# Patient Record
Sex: Female | Born: 1995
Health system: Southern US, Community
[De-identification: ages and names within clinical notes are randomized; demographics above are authoritative.]

## PROBLEM LIST (undated history)

## (undated) DIAGNOSIS — R112 Nausea with vomiting, unspecified: Secondary | ICD-10-CM

## (undated) DIAGNOSIS — R21 Rash and other nonspecific skin eruption: Secondary | ICD-10-CM

## (undated) DIAGNOSIS — F419 Anxiety disorder, unspecified: Secondary | ICD-10-CM

## (undated) DIAGNOSIS — G4730 Sleep apnea, unspecified: Secondary | ICD-10-CM

## (undated) DIAGNOSIS — Z9889 Other specified postprocedural states: Secondary | ICD-10-CM

## (undated) DIAGNOSIS — Z8489 Family history of other specified conditions: Secondary | ICD-10-CM

## (undated) HISTORY — DX: Rash and other nonspecific skin eruption: R21

## (undated) HISTORY — PX: WISDOM TOOTH EXTRACTION: SHX21

---
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
## 2016-03-31 DIAGNOSIS — F329 Major depressive disorder, single episode, unspecified: Secondary | ICD-10-CM | POA: Diagnosis not present

## 2016-03-31 DIAGNOSIS — Z1389 Encounter for screening for other disorder: Secondary | ICD-10-CM | POA: Diagnosis not present

## 2016-03-31 DIAGNOSIS — Z6837 Body mass index (BMI) 37.0-37.9, adult: Secondary | ICD-10-CM | POA: Diagnosis not present

## 2016-03-31 DIAGNOSIS — F419 Anxiety disorder, unspecified: Secondary | ICD-10-CM | POA: Diagnosis not present

## 2016-04-05 DIAGNOSIS — G4730 Sleep apnea, unspecified: Secondary | ICD-10-CM | POA: Diagnosis not present

## 2016-04-05 DIAGNOSIS — Z6837 Body mass index (BMI) 37.0-37.9, adult: Secondary | ICD-10-CM | POA: Diagnosis not present

## 2016-04-25 DIAGNOSIS — G4733 Obstructive sleep apnea (adult) (pediatric): Secondary | ICD-10-CM | POA: Diagnosis not present

## 2016-05-05 DIAGNOSIS — G4733 Obstructive sleep apnea (adult) (pediatric): Secondary | ICD-10-CM | POA: Diagnosis not present

## 2016-06-05 DIAGNOSIS — G4733 Obstructive sleep apnea (adult) (pediatric): Secondary | ICD-10-CM | POA: Diagnosis not present

## 2016-07-05 DIAGNOSIS — Z79899 Other long term (current) drug therapy: Secondary | ICD-10-CM | POA: Diagnosis not present

## 2016-07-05 DIAGNOSIS — G4733 Obstructive sleep apnea (adult) (pediatric): Secondary | ICD-10-CM | POA: Diagnosis not present

## 2016-07-05 DIAGNOSIS — F419 Anxiety disorder, unspecified: Secondary | ICD-10-CM | POA: Diagnosis not present

## 2016-07-05 DIAGNOSIS — Z1389 Encounter for screening for other disorder: Secondary | ICD-10-CM | POA: Diagnosis not present

## 2016-07-05 DIAGNOSIS — Z6837 Body mass index (BMI) 37.0-37.9, adult: Secondary | ICD-10-CM | POA: Diagnosis not present

## 2016-08-05 DIAGNOSIS — G4733 Obstructive sleep apnea (adult) (pediatric): Secondary | ICD-10-CM | POA: Diagnosis not present

## 2016-09-04 DIAGNOSIS — G4733 Obstructive sleep apnea (adult) (pediatric): Secondary | ICD-10-CM | POA: Diagnosis not present

## 2016-10-05 DIAGNOSIS — G4733 Obstructive sleep apnea (adult) (pediatric): Secondary | ICD-10-CM | POA: Diagnosis not present

## 2016-10-09 DIAGNOSIS — R350 Frequency of micturition: Secondary | ICD-10-CM | POA: Diagnosis not present

## 2016-10-09 DIAGNOSIS — F419 Anxiety disorder, unspecified: Secondary | ICD-10-CM | POA: Diagnosis not present

## 2016-10-09 DIAGNOSIS — Z6836 Body mass index (BMI) 36.0-36.9, adult: Secondary | ICD-10-CM | POA: Diagnosis not present

## 2016-10-09 DIAGNOSIS — N342 Other urethritis: Secondary | ICD-10-CM | POA: Diagnosis not present

## 2016-10-09 DIAGNOSIS — Z1389 Encounter for screening for other disorder: Secondary | ICD-10-CM | POA: Diagnosis not present

## 2017-03-07 DIAGNOSIS — G4733 Obstructive sleep apnea (adult) (pediatric): Secondary | ICD-10-CM | POA: Diagnosis not present

## 2017-04-07 DIAGNOSIS — G4733 Obstructive sleep apnea (adult) (pediatric): Secondary | ICD-10-CM | POA: Diagnosis not present

## 2017-05-05 DIAGNOSIS — G4733 Obstructive sleep apnea (adult) (pediatric): Secondary | ICD-10-CM | POA: Diagnosis not present

## 2017-05-08 ENCOUNTER — Emergency Department (HOSPITAL_COMMUNITY): Payer: BLUE CROSS/BLUE SHIELD

## 2017-05-08 ENCOUNTER — Other Ambulatory Visit: Payer: Self-pay

## 2017-05-08 ENCOUNTER — Emergency Department (HOSPITAL_COMMUNITY)
Admission: EM | Admit: 2017-05-08 | Discharge: 2017-05-08 | Disposition: A | Discharge status: Home / Self Care | Payer: BLUE CROSS/BLUE SHIELD | Attending: Emergency Medicine | Admitting: Emergency Medicine

## 2017-05-08 ENCOUNTER — Encounter (HOSPITAL_COMMUNITY): Payer: Self-pay | Admitting: Emergency Medicine

## 2017-05-08 DIAGNOSIS — K529 Noninfective gastroenteritis and colitis, unspecified: Secondary | ICD-10-CM

## 2017-05-08 DIAGNOSIS — K5909 Other constipation: Secondary | ICD-10-CM

## 2017-05-08 DIAGNOSIS — R1031 Right lower quadrant pain: Secondary | ICD-10-CM | POA: Diagnosis not present

## 2017-05-08 DIAGNOSIS — R109 Unspecified abdominal pain: Secondary | ICD-10-CM | POA: Diagnosis not present

## 2017-05-08 DIAGNOSIS — R110 Nausea: Secondary | ICD-10-CM | POA: Diagnosis not present

## 2017-05-08 HISTORY — DX: Anxiety disorder, unspecified: F41.9

## 2017-05-08 LAB — COMPREHENSIVE METABOLIC PANEL
ALK PHOS: 91 U/L (ref 38–126)
ALT: 28 U/L (ref 14–54)
AST: 108 U/L — AB (ref 15–41)
Albumin: 3.7 g/dL (ref 3.5–5.0)
Anion gap: 13 (ref 5–15)
BILIRUBIN TOTAL: 0.4 mg/dL (ref 0.3–1.2)
BUN: 10 mg/dL (ref 6–20)
CALCIUM: 9.3 mg/dL (ref 8.9–10.3)
CHLORIDE: 104 mmol/L (ref 101–111)
CO2: 21 mmol/L — ABNORMAL LOW (ref 22–32)
CREATININE: 0.84 mg/dL (ref 0.44–1.00)
GFR calc Af Amer: >60 mL/min (ref >60)
Glucose, Bld: 110 mg/dL — ABNORMAL HIGH (ref 65–99)
Potassium: 3.6 mmol/L (ref 3.5–5.1)
Sodium: 138 mmol/L (ref 135–145)
Total Protein: 7.9 g/dL (ref 6.5–8.1)

## 2017-05-08 LAB — CBC
HCT: 39.3 % (ref 36.0–46.0)
Hemoglobin: 12.9 g/dL (ref 12.0–15.0)
MCH: 27.2 pg (ref 26.0–34.0)
MCHC: 32.8 g/dL (ref 30.0–36.0)
MCV: 82.7 fL (ref 78.0–100.0)
PLATELETS: 404 10*3/uL — AB (ref 150–400)
RBC: 4.75 MIL/uL (ref 3.87–5.11)
RDW: 12.8 % (ref 11.5–15.5)
WBC: 15.1 10*3/uL — AB (ref 4.0–10.5)

## 2017-05-08 LAB — URINALYSIS, ROUTINE W REFLEX MICROSCOPIC
BILIRUBIN URINE: NEGATIVE
Glucose, UA: NEGATIVE mg/dL
Ketones, ur: NEGATIVE mg/dL
LEUKOCYTES UA: NEGATIVE
Nitrite: NEGATIVE
PH: 5 (ref 5.0–8.0)
Protein, ur: NEGATIVE mg/dL
SPECIFIC GRAVITY, URINE: 1.017 (ref 1.005–1.030)

## 2017-05-08 LAB — LIPASE, BLOOD: LIPASE: 24 U/L (ref 11–51)

## 2017-05-08 LAB — HCG, QUANTITATIVE, PREGNANCY: hCG, Beta Chain, Quant, S: <1 m[IU]/mL (ref <5)

## 2017-05-08 MED ORDER — HYDROMORPHONE HCL 1 MG/ML IJ SOLN
0.5000 mg | Freq: Once | INTRAMUSCULAR | Status: AC
Start: 2017-05-08 — End: 2017-05-08
  Administered 2017-05-08: 0.5 mg via INTRAVENOUS
  Filled 2017-05-08: qty 1

## 2017-05-08 MED ORDER — IOPAMIDOL (ISOVUE-300) INJECTION 61%
100.0000 mL | Freq: Once | INTRAVENOUS | Status: AC | PRN
  Administered 2017-05-08: 100 mL via INTRAVENOUS

## 2017-05-08 MED ORDER — TRAMADOL HCL 50 MG PO TABS: 50.0000 mg | ORAL_TABLET | Freq: Four times a day (QID) | ORAL | 0 refills | Status: DC | PRN

## 2017-05-08 MED ORDER — PROMETHAZINE HCL 25 MG/ML IJ SOLN
12.5000 mg | Freq: Four times a day (QID) | INTRAMUSCULAR | Status: DC | PRN
Start: 2017-05-08 — End: 2017-05-09
  Administered 2017-05-08: 12.5 mg via INTRAVENOUS
  Filled 2017-05-08: qty 1

## 2017-05-08 MED ORDER — MAGNESIUM HYDROXIDE 400 MG/5ML PO SUSP
30.0000 mL | Freq: Once | ORAL | Status: AC
  Administered 2017-05-08: 30 mL via ORAL
  Filled 2017-05-08: qty 30

## 2017-05-08 MED ORDER — HYDROMORPHONE HCL 1 MG/ML IJ SOLN
0.5000 mg | Freq: Once | INTRAMUSCULAR | Status: AC
  Administered 2017-05-08: 0.5 mg via INTRAVENOUS
  Filled 2017-05-08: qty 1

## 2017-05-08 MED ORDER — PROMETHAZINE HCL 12.5 MG PO TABS: 12.5000 mg | ORAL_TABLET | Freq: Four times a day (QID) | ORAL | 0 refills | Status: DC | PRN

## 2017-05-08 MED ORDER — ONDANSETRON HCL 4 MG/2ML IJ SOLN
4.0000 mg | Freq: Once | INTRAMUSCULAR | Status: AC
  Administered 2017-05-08: 4 mg via INTRAVENOUS
  Filled 2017-05-08: qty 2

## 2017-05-08 NOTE — Discharge Instructions (Signed)
Your CT scan is negative for abscess, appendicitis, gallbladder problems, or other emergent changes.  Your examination does show a condition called enteritis, which is an infection or inflammation involving your small intestines.  There is also noticed areas of increased stool and trapped gas.  Please use the laxative of your choice.  Please increase the fiber in your diet.  Increase fluids.  Enteritis will usually clear up in a few days on its own.  If you are not improving in the next 2-3 days, please see Dr. Benard Rinkoark for additional evaluation.  Return to the emergency department if any changes in your condition, problems, or concerns.

## 2017-05-08 NOTE — ED Triage Notes (Signed)
Pt states she was laying down and had sudden onset of right lower abdominal pain with nausea.

## 2017-05-08 NOTE — ED Provider Notes (Signed)
[facility] EMERGENCY DEPARTMENT Provider Note   CSN: [id number] Arrival date & time: 05/08/17  1650     History   Chief Complaint Chief Complaint  Patient presents with  . Abdominal Pain    HPI Jane Rivera is a 22 y.o. female.  Patient is a 22 year old female who presents to the emergency department with complaint of abdominal pain.  The patient states this problem started approximately 2 PM today.  She noted a pain in the right lower abdomen.  She rated it as a 5 on a scale of 1-10 when this started.  She had not had any previous abdominal pain, nausea, or vomiting up to that time.  She has been working out earlier during the day, but she states she does not feel that the workout had anything to do with the pain.  She describes the pain as mostly a throbbing type pain, but at times it was sharp.  The pain seems to come and go.  At the time of interview the pain is now a 7 on a scale of 1-10.  There is no vomiting appreciated, but there is some nausea noted.  There is been no recent trauma or injury reported, no recent operations are reported.  The patient's last bowel movement was earlier today, and there was no problem with constipation or diarrhea.  The last menstrual cycle was last week.  The patient is on birth control pills, and is not had a change in her medication recently.  No recent problems with dysuria.  And no recent problems with some vaginal discharge.  The patient presents now for assistance with this issue.   The history is provided by the patient.    Past Medical History:  Diagnosis Date  . Anxiety     There are no active problems to display for this patient.   Past Surgical History:  Procedure Laterality Date  . WISDOM TOOTH EXTRACTION      OB History    No data available       Home Medications    Prior to Admission medications   Not on File    Family History No family history on file.  Social History Social History   Tobacco Use  .  Smoking status: Never Smoker  . Smokeless tobacco: Never Used  Substance Use Topics  . Alcohol use: No    Frequency: Never  . Drug use: No     Allergies   Patient has no allergy information on record.   Review of Systems Review of Systems  Constitutional: Positive for activity change. Negative for fever.       All ROS Neg except as noted in HPI  HENT: Negative for nosebleeds.   Eyes: Negative for photophobia and discharge.  Respiratory: Negative for cough, shortness of breath and wheezing.   Cardiovascular: Negative for chest pain and palpitations.  Gastrointestinal: Positive for abdominal pain and nausea. Negative for blood in stool, constipation, diarrhea and vomiting.  Genitourinary: Negative for dysuria, frequency, hematuria, vaginal bleeding, vaginal discharge and vaginal pain.  Musculoskeletal: Negative for arthralgias, back pain and neck pain.  Skin: Negative.   Neurological: Negative for dizziness, seizures and speech difficulty.  Psychiatric/Behavioral: Negative for confusion and hallucinations.     Physical Exam Updated Vital Signs BP 118/74 (BP Location: Right Arm)   Pulse 84   Temp 97.7 F (36.5 C) (Oral)   Resp 16   Ht 5\' 4"  (1.626 m)   Wt 95.3 kg (210 lb)  SpO2 93%   BMI 36.05 kg/m   Physical Exam  Constitutional: She is oriented to person, place, and time. She appears well-developed and well-nourished.  Non-toxic appearance.  HENT:  Head: Normocephalic.  Right Ear: Tympanic membrane and external ear normal.  Left Ear: Tympanic membrane and external ear normal.  Eyes: EOM and lids are normal. Pupils are equal, round, and reactive to light.  Neck: Normal range of motion. Neck supple. Carotid bruit is not present.  Cardiovascular: Normal rate, regular rhythm, normal heart sounds, intact distal pulses and normal pulses.  Pulmonary/Chest: Breath sounds normal. No respiratory distress.  Abdominal: Soft. Bowel sounds are normal. There is no splenomegaly  or hepatomegaly. There is tenderness in the right lower quadrant. There is guarding.  Right straight leg raise increases pain of the right lower quadrant.  Musculoskeletal: Normal range of motion.  Lymphadenopathy:       Head (right side): No submandibular adenopathy present.       Head (left side): No submandibular adenopathy present.    She has no cervical adenopathy.  Neurological: She is alert and oriented to person, place, and time. She has normal strength. No cranial nerve deficit or sensory deficit.  Skin: Skin is warm and dry.  Psychiatric: She has a normal mood and affect. Her speech is normal.  Nursing note and vitals reviewed.    ED Treatments / Results  Labs (all labs ordered are listed, but only abnormal results are displayed) Labs Reviewed  COMPREHENSIVE METABOLIC PANEL - Abnormal; Notable for the following components:      Result Value   CO2 21 (*)    Glucose, Bld 110 (*)    AST 108 (*)    All other components within normal limits  CBC - Abnormal; Notable for the following components:   WBC 15.1 (*)    Platelets 404 (*)    All other components within normal limits  URINALYSIS, ROUTINE W REFLEX MICROSCOPIC - Abnormal; Notable for the following components:   Hgb urine dipstick MODERATE (*)    Bacteria, UA RARE (*)    Squamous Epithelial / LPF 0-5 (*)    All other components within normal limits  LIPASE, BLOOD  HCG, QUANTITATIVE, PREGNANCY    EKG  EKG Interpretation None       Radiology No results found.  Procedures Procedures (including critical care time)  Medications Ordered in ED Medications - No data to display   Initial Impression / Assessment and Plan / ED Course  I have reviewed the triage vital signs and the nursing notes.  Pertinent labs & imaging results that were available during my care of the patient were reviewed by me and considered in my medical decision making (see chart for details).      Final Clinical Impressions(s) / ED  Diagnoses MDM  Vital signs reviewed. Lab work reviewed. WBC elevated at 15,000. preg test neg. Chem negative for acute problem.  IV pain meds given. CT scan ordered.  CT scan reveals enteritis of small intestine and constipation/stool burden.  Pt advised on the enteritis. Instruction for high fiber diet given. Rx for ultram given for pain. F/u with Dr Jena Gauss [facility] if not improving.   Final diagnoses:  Enteritis  Other constipation    ED Discharge Orders    None       Ivery Quale, PA-C 05/08/17 2308    Eber Hong, MD 05/09/17 [id number]

## 2017-05-14 DIAGNOSIS — Z1389 Encounter for screening for other disorder: Secondary | ICD-10-CM | POA: Diagnosis not present

## 2017-05-14 DIAGNOSIS — K589 Irritable bowel syndrome without diarrhea: Secondary | ICD-10-CM | POA: Diagnosis not present

## 2017-05-14 DIAGNOSIS — K5909 Other constipation: Secondary | ICD-10-CM | POA: Diagnosis not present

## 2017-05-14 DIAGNOSIS — Z6837 Body mass index (BMI) 37.0-37.9, adult: Secondary | ICD-10-CM | POA: Diagnosis not present

## 2017-05-14 DIAGNOSIS — F329 Major depressive disorder, single episode, unspecified: Secondary | ICD-10-CM | POA: Diagnosis not present

## 2017-05-22 ENCOUNTER — Encounter: Payer: Self-pay | Admitting: Internal Medicine

## 2017-07-24 ENCOUNTER — Ambulatory Visit: Payer: BLUE CROSS/BLUE SHIELD | Admitting: Gastroenterology

## 2017-07-24 ENCOUNTER — Encounter: Payer: Self-pay | Admitting: Gastroenterology

## 2017-07-24 VITALS — BP 122/100 | HR 108 | Temp 98.0°F | Ht 64.0 in | Wt 222.2 lb

## 2017-07-24 DIAGNOSIS — K589 Irritable bowel syndrome without diarrhea: Secondary | ICD-10-CM | POA: Insufficient documentation

## 2017-07-24 DIAGNOSIS — R1013 Epigastric pain: Secondary | ICD-10-CM | POA: Diagnosis not present

## 2017-07-24 DIAGNOSIS — K219 Gastro-esophageal reflux disease without esophagitis: Secondary | ICD-10-CM | POA: Insufficient documentation

## 2017-07-24 DIAGNOSIS — K582 Mixed irritable bowel syndrome: Secondary | ICD-10-CM

## 2017-07-24 MED ORDER — LINACLOTIDE 145 MCG PO CAPS: 145.0000 ug | ORAL_CAPSULE | Freq: Every day | ORAL | 5 refills | Status: DC

## 2017-07-24 NOTE — Patient Instructions (Signed)
1. Try Linzess once daily before breakfast for constipation. Call if too expensive/not covered on insurance.  2. Please have your labs done. We will contact you with results as available.  3. Return in 3 months for follow up but call sooner if needed or send me a message on MyChart.       Irritable Bowel Syndrome, Adult Irritable bowel syndrome (IBS) is not one specific disease. It is a group of symptoms that affects the organs responsible for digestion (gastrointestinal or GI tract). To regulate how your GI tract works, your body sends signals back and forth between your intestines and your brain. If you have IBS, there may be a problem with these signals. As a result, your GI tract does not function normally. Your intestines may become more sensitive and overreact to certain things. This is especially true when you eat certain foods or when you are under stress. There are four types of IBS. These may be determined based on the consistency of your stool:  IBS with diarrhea.  IBS with constipation.  Mixed IBS.  Unsubtyped IBS.  It is important to know which type of IBS you have. Some treatments are more likely to be helpful for certain types of IBS. What are the causes? The exact cause of IBS is not known. What increases the risk? You may have a higher risk of IBS if:  You are a woman.  You are younger than 23 years old.  You have a family history of IBS.  You have mental health problems.  You have had bacterial infection of your GI tract.  What are the signs or symptoms? Symptoms of IBS vary from person to person. The main symptom is abdominal pain or discomfort. Additional symptoms usually include one or more of the following:  Diarrhea, constipation, or both.  Abdominal swelling or bloating.  Feeling full or sick after eating a small or regular-size meal.  Frequent gas.  Mucus in the stool.  A feeling of having more stool left after a bowel movement.  Symptoms  tend to come and go. They may be associated with stress, psychiatric conditions, or nothing at all. How is this diagnosed? There is no specific test to diagnose IBS. Your health care provider will make a diagnosis based on a physical exam, medical history, and your symptoms. You may have other tests to rule out other conditions that may be causing your symptoms. These may include:  Blood tests.  X-rays.  CT scan.  Endoscopy and colonoscopy. This is a test in which your GI tract is viewed with a long, thin, flexible tube.  How is this treated? There is no cure for IBS, but treatment can help relieve symptoms. IBS treatment often includes:  Changes to your diet, such as: ? Eating more fiber. ? Avoiding foods that cause symptoms. ? Drinking more water. ? Eating regular, medium-sized portioned meals.  Medicines. These may include: ? Fiber supplements if you have constipation. ? Medicine to control diarrhea (antidiarrheal medicines). ? Medicine to help control muscle spasms in your GI tract (antispasmodic medicines). ? Medicines to help with any mental health issues, such as antidepressants or tranquilizers.  Therapy. ? Talk therapy may help with anxiety, depression, or other mental health issues that can make IBS symptoms worse.  Stress reduction. ? Managing your stress can help keep symptoms under control.  Follow these instructions at home:  Take medicines only as directed by your health care provider.  Eat a healthy diet. ? Avoid foods  and drinks with added sugar. ? Include more whole grains, fruits, and vegetables gradually into your diet. This may be especially helpful if you have IBS with constipation. ? Avoid any foods and drinks that make your symptoms worse. These may include dairy products and caffeinated or carbonated drinks. ? Do not eat large meals. ? Drink enough fluid to keep your urine clear or pale yellow.  Exercise regularly. Ask your health care provider for  recommendations of good activities for you.  Keep all follow-up visits as directed by your health care provider. This is important. Contact a health care provider if:  You have constant pain.  You have trouble or pain with swallowing.  You have worsening diarrhea. Get help right away if:  You have severe and worsening abdominal pain.  You have diarrhea and: ? You have a rash, stiff neck, or severe headache. ? You are irritable, sleepy, or difficult to awaken. ? You are weak, dizzy, or extremely thirsty.  You have bright red blood in your stool or you have black tarry stools.  You have unusual abdominal swelling that is painful.  You vomit continuously.  You vomit blood (hematemesis).  You have both abdominal pain and a fever. This information is not intended to replace advice given to you by your health care provider. Make sure you discuss any questions you have with your health care provider. Document Released: 02/06/2005 Document Revised: 07/09/2015 Document Reviewed: 10/24/2013 Elsevier Interactive Patient Education  2018 [employer].    Diet for Irritable Bowel Syndrome When you have irritable bowel syndrome (IBS), the foods you eat and your eating habits are very important. IBS may cause various symptoms, such as abdominal pain, constipation, or diarrhea. Choosing the right foods can help ease discomfort caused by these symptoms. Work with your health care provider and dietitian to find the best eating plan to help control your symptoms. What general guidelines do I need to follow?  Keep a food diary. This will help you identify foods that cause symptoms. Write down: ? What you eat and when. ? What symptoms you have. ? When symptoms occur in relation to your meals.  Avoid foods that cause symptoms. Talk with your dietitian about other ways to get the same nutrients that are in these foods.  Eat more foods that contain fiber. Take a fiber supplement if directed by  your dietitian.  Eat your meals slowly, in a relaxed setting.  Aim to eat 5-6 small meals per day. Do not skip meals.  Drink enough fluids to keep your urine clear or pale yellow.  Ask your health care provider if you should take an over-the-counter probiotic during flare-ups to help restore healthy gut bacteria.  If you have cramping or diarrhea, try making your meals low in fat and high in carbohydrates. Examples of carbohydrates are pasta, rice, whole grain breads and cereals, fruits, and vegetables.  If dairy products cause your symptoms to flare up, try eating less of them. You might be able to handle yogurt better than other dairy products because it contains bacteria that help with digestion. What foods are not recommended? The following are some foods and drinks that may worsen your symptoms:  Fatty foods, such as [place] fries.  Milk products, such as cheese or ice cream.  Chocolate.  Alcohol.  Products with caffeine, such as coffee.  Carbonated drinks, such as soda.  The items listed above may not be a complete list of foods and beverages to avoid. Contact your dietitian  for more information. What foods are good sources of fiber? Your health care provider or dietitian may recommend that you eat more foods that contain fiber. Fiber can help reduce constipation and other IBS symptoms. Add foods with fiber to your diet a little at a time so that your body can get used to them. Too much fiber at once might cause gas and swelling of your abdomen. The following are some foods that are good sources of fiber:  Apples.  Peaches.  Pears.  Berries.  Figs.  Broccoli (raw).  Cabbage.  Carrots.  Raw peas.  Kidney beans.  Lima beans.  Whole grain bread.  Whole grain cereal.  Where to find more information: [employer] for Functional Gastrointestinal Disorders: www.iffgd.[employer] National Institute of Diabetes and Digestive and Kidney Diseases:  http://norris-lawson.com/www.niddk.nih.gov/health-information/health-topics/digestive-diseases/ibs/Pages/facts.aspx This information is not intended to replace advice given to you by your health care provider. Make sure you discuss any questions you have with your health care provider. Document Released: 04/29/2003 Document Revised: 07/15/2015 Document Reviewed: 05/09/2013 Elsevier Interactive Patient Education  2018 Elsevier Inc.     Gastroesophageal Reflux Disease, Adult Normally, food travels down the esophagus and stays in the stomach to be digested. However, when a person has gastroesophageal reflux disease (GERD), food and stomach acid move back up into the esophagus. When this happens, the esophagus becomes sore and inflamed. Over time, GERD can create small holes (ulcers) in the lining of the esophagus. What are the causes? This condition is caused by a problem with the muscle between the esophagus and the stomach (lower esophageal sphincter, or LES). Normally, the LES muscle closes after food passes through the esophagus to the stomach. When the LES is weakened or abnormal, it does not close properly, and that allows food and stomach acid to go back up into the esophagus. The LES can be weakened by certain dietary substances, medicines, and medical conditions, including:  Tobacco use.  Pregnancy.  Having a hiatal hernia.  Heavy alcohol use.  Certain foods and beverages, such as coffee, chocolate, onions, and peppermint.  What increases the risk? This condition is more likely to develop in:  People who have an increased body weight.  People who have connective tissue disorders.  People who use NSAID medicines.  What are the signs or symptoms? Symptoms of this condition include:  Heartburn.  Difficult or painful swallowing.  The feeling of having a lump in the throat.  Abitter taste in the mouth.  Bad breath.  Having a large amount of saliva.  Having an upset or bloated  stomach.  Belching.  Chest pain.  Shortness of breath or wheezing.  Ongoing (chronic) cough or a night-time cough.  Wearing away of tooth enamel.  Weight loss.  Different conditions can cause chest pain. Make sure to see your health care provider if you experience chest pain. How is this diagnosed? Your health care provider will take a medical history and perform a physical exam. To determine if you have mild or severe GERD, your health care provider may also monitor how you respond to treatment. You may also have other tests, including:  An endoscopy toexamine your stomach and esophagus with a small camera.  A test thatmeasures the acidity level in your esophagus.  A test thatmeasures how much pressure is on your esophagus.  A barium swallow or modified barium swallow to show the shape, size, and functioning of your esophagus.  How is this treated? The goal of treatment is to help relieve your symptoms  and to prevent complications. Treatment for this condition may vary depending on how severe your symptoms are. Your health care provider may recommend:  Changes to your diet.  Medicine.  Surgery.  Follow these instructions at home: Diet  Follow a diet as recommended by your health care provider. This may involve avoiding foods and drinks such as: ? Coffee and tea (with or without caffeine). ? Drinks that containalcohol. ? Energy drinks and sports drinks. ? Carbonated drinks or sodas. ? Chocolate and cocoa. ? Peppermint and mint flavorings. ? Garlic and onions. ? Horseradish. ? Spicy and acidic foods, including peppers, chili powder, curry powder, vinegar, hot sauces, and barbecue sauce. ? Citrus fruit juices and citrus fruits, such as oranges, lemons, and limes. ? Tomato-based foods, such as red sauce, chili, salsa, and pizza with red sauce. ? Fried and fatty foods, such as donuts, french fries, potato chips, and high-fat dressings. ? High-fat meats, such as hot  dogs and fatty cuts of red and white meats, such as rib eye steak, sausage, ham, and bacon. ? High-fat dairy items, such as whole milk, butter, and cream cheese.  Eat small, frequent meals instead of large meals.  Avoid drinking large amounts of liquid with your meals.  Avoid eating meals during the 2-3 hours before bedtime.  Avoid lying down right after you eat.  Do not exercise right after you eat. General instructions  Pay attention to any changes in your symptoms.  Take over-the-counter and prescription medicines only as told by your health care provider. Do not take aspirin, ibuprofen, or other NSAIDs unless your health care provider told you to do so.  Do not use any tobacco products, including cigarettes, chewing tobacco, and e-cigarettes. If you need help quitting, ask your health care provider.  Wear loose-fitting clothing. Do not wear anything tight around your waist that causes pressure on your abdomen.  Raise (elevate) the head of your bed 6 inches (15cm).  Try to reduce your stress, such as with yoga or meditation. If you need help reducing stress, ask your health care provider.  If you are overweight, reduce your weight to an amount that is healthy for you. Ask your health care provider for guidance about a safe weight loss goal.  Keep all follow-up visits as told by your health care provider. This is important. Contact a health care provider if:  You have new symptoms.  You have unexplained weight loss.  You have difficulty swallowing, or it hurts to swallow.  You have wheezing or a persistent cough.  Your symptoms do not improve with treatment.  You have a hoarse voice. Get help right away if:  You have pain in your arms, neck, jaw, teeth, or back.  You feel sweaty, dizzy, or light-headed.  You have chest pain or shortness of breath.  You vomit and your vomit looks like blood or coffee grounds.  You faint.  Your stool is bloody or black.  You  cannot swallow, drink, or eat. This information is not intended to replace advice given to you by your health care provider. Make sure you discuss any questions you have with your health care provider. Document Released: 11/16/2004 Document Revised: 07/07/2015 Document Reviewed: 06/03/2014 Elsevier Interactive Patient Education  [employer].

## 2017-07-24 NOTE — Assessment & Plan Note (Signed)
No alarm features.  Reinforced reflux antireflux measures, handout provided.  She will continue to use Tums as needed for now.  If her symptoms become more frequent she will let us know, at that time would consider ranitidine or PPI.  Office visit in 3 months.

## 2017-07-24 NOTE — Assessment & Plan Note (Signed)
22 year old female with predominant constipation, intermittent diarrhea occurring most of her life.  Abdominal pain vague, reports daily upper abdominal discomfort with some relief with bowel movements.  Suspect IBS constipation predominant versus chronic idiopathic constipation.  Plan for routine labs including thyroid function, screening for celiac disease. She also had elevated AST during recent ED evaluation, will follow up on that.  Start Linzess 145 mcg daily on an empty stomach.  We will touch base with her once labs are back to see how she is doing.  In addition she will come back for follow-up.

## 2017-07-24 NOTE — Progress Notes (Signed)
Primary Care Physician:  Assunta FoundGolding, John, MD  Primary Gastroenterologist:  Roetta SessionsMichael Rourk, MD   Chief Complaint  Patient presents with  . Constipation  . Diarrhea    HPI:  Jane Rivera is a 22 y.o. female here for further evaluation of bowel concerns.  Patient states she is always had issues with constipation and abdominal pain.  Her first emergency department visit was in the ninth grade when she was told she had constipation.  She was seen in the emergency department back in March for abdominal pain, right lower quadrant.  CT scan showed constipation and possible mild localized ileus versus enteritis of the small bowel in the left hemiabdomen.  Labs unremarkable except for AST of 108.  Denies any diarrhea at that time.  Averaging 2-3 stools per week.  Uses MiraLAX if she has not had a bowel movement in a couple of days or if she develops abdominal pain.  Sometimes uses enemas.  Does not take anything regularly.  No prescription medications for constipation.  Denies melena or rectal bleeding.  Symptoms associated with upper abdominal pain.  She has at most of the time but does seem to have some improvement after a bowel movement.  She has heartburn several times per week, more frequent than in the past.  Occasional nausea but no vomiting.  No dysphagia.  No unintentional weight loss.  She controls her heartburn with Tums.  Does not want daily medication at this point.  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  Current Outpatient Medications  Medication Sig Dispense Refill  . buPROPion (WELLBUTRIN XL) 300 MG 24 hr tablet Take 1 tablet by mouth daily.    . cetirizine (ZYRTEC) 10 MG tablet Take 10 mg by mouth daily.    Marland Kitchen. escitalopram (LEXAPRO) 20 MG tablet Take 1 tablet by mouth daily.  3  . TRI-SPRINTEC 0.18/0.215/0.25 MG-35 MCG tablet Take 1 tablet by mouth daily.  2   No current facility-administered medications for this visit.     Allergies as of 07/24/2017 - never reviewed  Allergen Reaction  Noted  . Amoxicillin  07/24/2017    Past Medical History:  Diagnosis Date  . Anxiety   . Rash    controlled with zyrtec    Past Surgical History:  Procedure Laterality Date  . WISDOM TOOTH EXTRACTION      Family History  Problem Relation Age of Onset  . Hypertension Mother   . Hypertension Father   . Inflammatory bowel disease Neg Hx   . Celiac disease Neg Hx   . Colon cancer Neg Hx     Social History   Socioeconomic History  . Marital status: Single    Spouse name: Not on file  . Number of children: Not on file  . Years of education: Not on file  . Highest education level: Not on file  Occupational History  . Not on file  Social Needs  . Financial resource strain: Not on file  . Food insecurity:    Worry: Not on file    Inability: Not on file  . Transportation needs:    Medical: Not on file    Non-medical: Not on file  Tobacco Use  . Smoking status: Never Smoker  . Smokeless tobacco: Never Used  Substance and Sexual Activity  . Alcohol use: No    Frequency: Never  . Drug use: No  . Sexual activity: Not on file  Lifestyle  . Physical activity:    Days per week: Not on  file    Minutes per session: Not on file  . Stress: Not on file  Relationships  . Social connections:    Talks on phone: Not on file    Gets together: Not on file    Attends religious service: Not on file    Active member of club or organization: Not on file    Attends meetings of clubs or organizations: Not on file    Relationship status: Not on file  . Intimate partner violence:    Fear of current or ex partner: Not on file    Emotionally abused: Not on file    Physically abused: Not on file    Forced sexual activity: Not on file  Other Topics Concern  . Not on file  Social History Narrative   Graduated from [employer].      ROS:  General: Negative for anorexia, weight loss, fever, chills, fatigue, weakness. Eyes: Negative for vision changes.  ENT: Negative for  hoarseness, difficulty swallowing , nasal congestion. CV: Negative for chest pain, angina, palpitations, dyspnea on exertion, peripheral edema.  Respiratory: Negative for dyspnea at rest, dyspnea on exertion, cough, sputum, wheezing.  GI: See history of present illness. GU:  Negative for dysuria, hematuria, urinary incontinence, urinary frequency, nocturnal urination.  MS: Negative for joint pain, low back pain.  Derm: Negative for rash or itching.  Neuro: Negative for weakness, abnormal sensation, seizure, frequent headaches, memory loss, confusion.  Psych: Negative for depression, suicidal ideation, hallucinations.  Positive anxiety. Endo: Negative for unusual weight change.  Heme: Negative for bruising or bleeding. Allergy: Negative for rash or hives.    Physical Examination:  BP (!) 122/100 (BP Location: Left Arm)   Pulse (!) 108   Temp 98 F (36.7 C) (Oral)   Ht 5\' 4"  (1.626 m)   Wt 222 lb 3.2 oz (100.8 kg)   LMP 07/19/2017 (Exact Date)   BMI 38.14 kg/m    General: Well-nourished, well-developed in no acute distress.  Head: Normocephalic, atraumatic.   Eyes: Conjunctiva pink, no icterus. Mouth: Oropharyngeal mucosa moist and pink , no lesions erythema or exudate. Neck: Supple without thyromegaly, masses, or lymphadenopathy.  Lungs: Clear to auscultation bilaterally.  Heart: Regular rate and rhythm, no murmurs rubs or gallops.  Abdomen: Bowel sounds are normal, nontender, nondistended, no hepatosplenomegaly or masses, no abdominal bruits or hernia, no rebound or guarding.   Rectal: not performed Extremities: No lower extremity edema. No clubbing or deformities.  Neuro: Alert and oriented x 4 , grossly normal neurologically.  Skin: Warm and dry, no rash or jaundice.   Psych: Alert and cooperative, normal mood and affect.  Labs: Lab Results  Component Value Date   CREATININE 0.84 05/08/2017   BUN 10 05/08/2017   NA 138 05/08/2017   K 3.6 05/08/2017   CL 104 05/08/2017    CO2 21 (L) 05/08/2017   Lab Results  Component Value Date   ALT 28 05/08/2017   AST 108 (H) 05/08/2017   ALKPHOS 91 05/08/2017   BILITOT 0.4 05/08/2017   Lab Results  Component Value Date   LIPASE 24 05/08/2017     Imaging Studies:   CLINICAL DATA:  Right lower abdominal pain with nausea of sudden onset.  EXAM: CT ABDOMEN AND PELVIS WITH CONTRAST  TECHNIQUE: Multidetector CT imaging of the abdomen and pelvis was performed using the standard protocol following bolus administration of intravenous contrast.  CONTRAST:  ISOVUE-300 IOPAMIDOL (ISOVUE-300) INJECTION 61%  COMPARISON:  None.  FINDINGS: Lower  chest: Normal size heart.  Clear lung bases.  Hepatobiliary: No focal liver abnormality is seen. No gallstones, gallbladder wall thickening, or biliary dilatation.  Pancreas: Unremarkable. No pancreatic ductal dilatation or surrounding inflammatory changes.  Spleen: Normal in size without focal abnormality.  Adrenals/Urinary Tract: Adrenal glands are unremarkable. Kidneys are normal, without renal calculi, focal lesion, or hydronephrosis. Bladder is unremarkable.  Stomach/Bowel: Mild fluid-filled distention of small bowel loops in the left hemiabdomen possibly representing mild small bowel ileus or dysmotility. Enteritis is also possibility. Normal appendix. Moderate stool burden within the colon.  Vascular/Lymphatic: No significant vascular findings are present. No enlarged abdominal or pelvic lymph nodes.  Reproductive: Uterus and bilateral adnexa are unremarkable.  Other: No abdominal wall hernia or abnormality. No abdominopelvic ascites.  Musculoskeletal: No acute or significant osseous findings.  IMPRESSION: 1. Mild fluid-filled distention of small bowel in the left hemiabdomen raises the possibly of mild localized ileus or enteritis. Small bowel dysmotility is also possibility. No mechanical bowel obstruction. 2. Normal  appendix. 3. Moderate fecal retention within the colon.   Electronically Signed   By: Tollie Eth M.D.   On: 05/08/2017 22:27

## 2017-07-24 NOTE — Progress Notes (Signed)
CC'D TO PCP °

## 2017-07-25 ENCOUNTER — Telehealth: Payer: Self-pay

## 2017-07-25 NOTE — Telephone Encounter (Signed)
LMOM, waiting on a return call to discuss medication Linzess needed a PA.

## 2017-07-25 NOTE — Telephone Encounter (Signed)
Pt returned call and has a separate card for prescriptions. Bin [id number], Pcn [id number], [facility], [place] [id number]. Will work on [place].

## 2017-07-26 LAB — CBC WITH DIFFERENTIAL/PLATELET
BASOS: 0 %
Basophils Absolute: 0.1 10*3/uL (ref 0.0–0.2)
EOS (ABSOLUTE): 0.5 10*3/uL — AB (ref 0.0–0.4)
EOS: 4 %
HEMATOCRIT: 41.8 % (ref 34.0–46.6)
Hemoglobin: 14 g/dL (ref 11.1–15.9)
IMMATURE GRANULOCYTES: 0 %
Immature Grans (Abs): 0 10*3/uL (ref 0.0–0.1)
LYMPHS ABS: 3.5 10*3/uL — AB (ref 0.7–3.1)
Lymphs: 27 %
MCH: 27.7 pg (ref 26.6–33.0)
MCHC: 33.5 g/dL (ref 31.5–35.7)
MCV: 83 fL (ref 79–97)
MONOS ABS: 0.7 10*3/uL (ref 0.1–0.9)
Monocytes: 5 %
NEUTROS ABS: 8.3 10*3/uL — AB (ref 1.4–7.0)
Neutrophils: 64 %
PLATELETS: 432 10*3/uL (ref 150–450)
RBC: 5.06 x10E6/uL (ref 3.77–5.28)
RDW: 13.4 % (ref 12.3–15.4)
WBC: 13 10*3/uL — ABNORMAL HIGH (ref 3.4–10.8)

## 2017-07-26 LAB — TISSUE TRANSGLUTAMINASE, IGA

## 2017-07-26 LAB — HEPATIC FUNCTION PANEL
ALK PHOS: 107 IU/L (ref 39–117)
ALT: 23 IU/L (ref 0–32)
AST: 20 IU/L (ref 0–40)
Albumin: 4.7 g/dL (ref 3.5–5.5)
BILIRUBIN, DIRECT: 0.08 mg/dL (ref 0.00–0.40)
Bilirubin Total: 0.3 mg/dL (ref 0.0–1.2)
TOTAL PROTEIN: 7.8 g/dL (ref 6.0–8.5)

## 2017-07-26 LAB — TSH: TSH: 1.06 u[IU]/mL (ref 0.450–4.500)

## 2017-07-26 LAB — IGA: IGA/IMMUNOGLOBULIN A, SERUM: 224 mg/dL (ref 87–352)

## 2017-07-26 NOTE — Telephone Encounter (Signed)
PA was approved for Linzess. Pt notified and [facility] was notified.

## 2017-07-30 ENCOUNTER — Other Ambulatory Visit: Payer: Self-pay

## 2017-07-30 DIAGNOSIS — D72829 Elevated white blood cell count, unspecified: Secondary | ICD-10-CM

## 2017-08-13 ENCOUNTER — Telehealth: Payer: Self-pay | Admitting: *Deleted

## 2017-08-13 NOTE — Telephone Encounter (Signed)
Patient states she is taking the linzess every other day and feels it is still going straight through her. She wants to know if she should try a lower dosage?

## 2017-08-14 MED ORDER — LINACLOTIDE 72 MCG PO CAPS: 72.0000 ug | ORAL_CAPSULE | Freq: Every day | ORAL | 11 refills | Status: DC

## 2017-08-14 NOTE — Telephone Encounter (Signed)
rx for linzess daily sent to [facility]

## 2017-08-14 NOTE — Telephone Encounter (Signed)
Patient aware.

## 2017-08-14 NOTE — Addendum Note (Signed)
Addended by: Tiffany KocherLEWIS, Tina Temme S on: 08/14/2017 01:35 PM   Modules accepted: Orders

## 2017-08-21 DIAGNOSIS — F419 Anxiety disorder, unspecified: Secondary | ICD-10-CM | POA: Diagnosis not present

## 2017-08-21 DIAGNOSIS — Z6837 Body mass index (BMI) 37.0-37.9, adult: Secondary | ICD-10-CM | POA: Diagnosis not present

## 2017-08-21 DIAGNOSIS — F329 Major depressive disorder, single episode, unspecified: Secondary | ICD-10-CM | POA: Diagnosis not present

## 2017-08-21 DIAGNOSIS — Z1389 Encounter for screening for other disorder: Secondary | ICD-10-CM | POA: Diagnosis not present

## 2017-08-29 DIAGNOSIS — L308 Other specified dermatitis: Secondary | ICD-10-CM | POA: Diagnosis not present

## 2017-08-29 DIAGNOSIS — L700 Acne vulgaris: Secondary | ICD-10-CM | POA: Diagnosis not present

## 2017-10-30 ENCOUNTER — Ambulatory Visit: Payer: BLUE CROSS/BLUE SHIELD | Admitting: Gastroenterology

## 2018-02-06 ENCOUNTER — Encounter: Payer: Self-pay | Admitting: Gastroenterology

## 2018-02-06 ENCOUNTER — Ambulatory Visit: Payer: BC Managed Care – PPO | Admitting: Gastroenterology

## 2018-02-06 VITALS — BP 138/89 | HR 89 | Temp 97.7°F | Ht 64.0 in | Wt 222.6 lb

## 2018-02-06 DIAGNOSIS — K581 Irritable bowel syndrome with constipation: Secondary | ICD-10-CM | POA: Diagnosis not present

## 2018-02-06 NOTE — Assessment & Plan Note (Signed)
Doing well on Linzess 72 mcg daily. Over the past couple months she has had some intermittent bright red blood ("specks") on the toilet tissue about twice per week.  Suspect benign source, may consider colonoscopy, to discuss with Dr. Jena Gaussourk.

## 2018-02-06 NOTE — Patient Instructions (Signed)
Continue Linzess 72mcg once daily.   We will be in touch in the next week regarding recommendations from Dr. Jena Gaussourk about possible colonoscopy.

## 2018-02-06 NOTE — Progress Notes (Signed)
Primary Care Physician: Assunta Found, MD  Primary Gastroenterologist:  Roetta Sessions, MD   Chief Complaint  Patient presents with  . Irritable Bowel Syndrome    ok    HPI: Jane Rivera is a 22 y.o. female here for follow-up.  She was seen back in June for constipation.  Predominantly constipated but with intermittent diarrhea most of her life.  Suspected IBS obstipation versus idiopathic constipation.  Started on Linzess 145 mcg daily at her last office visit.  It was too strong for her so she was decreased to 72 mcg daily.  Intermittent heartburn controlled with Tums.  LFTs repeated for elevated AST previously, on repeat they were normal.  Celiac screen, thyroid function also normal.  No anemia.  Her white blood cell count remained elevated but down from 15,000 nine months ago to 13,000 six months ago.  She was advised to have a repeat CBC but she has not done this today.  brbpr twice per week for couple of months. Very small amount, few specks on the toilet tissue with wiping. Doesn't strain. No anorectal irritation or pain. No abd pain. Started on doxycycline for rosacea.  Current Outpatient Medications  Medication Sig Dispense Refill  . buPROPion (WELLBUTRIN XL) 300 MG 24 hr tablet Take 1 tablet by mouth daily.    . cetirizine (ZYRTEC) 10 MG tablet Take 10 mg by mouth daily.    Marland Kitchen doxycycline (VIBRAMYCIN) 100 MG capsule Take 100 mg by mouth daily.    Marland Kitchen escitalopram (LEXAPRO) 20 MG tablet Take 1 tablet by mouth daily.  3  . linaclotide (LINZESS) 72 MCG capsule Take 1 capsule (72 mcg total) by mouth daily before breakfast. 30 capsule 11  . TRI-SPRINTEC 0.18/0.215/0.25 MG-35 MCG tablet Take 1 tablet by mouth daily.  2   No current facility-administered medications for this visit.     Allergies as of 02/06/2018 - Review Complete 02/06/2018  Allergen Reaction Noted  . Amoxicillin  07/24/2017   Past Medical History:  Diagnosis Date  . Anxiety   . Rash    controlled with  zyrtec   Past Surgical History:  Procedure Laterality Date  . WISDOM TOOTH EXTRACTION     Family History  Problem Relation Age of Onset  . Hypertension Mother   . Hypertension Father   . Inflammatory bowel disease Neg Hx   . Celiac disease Neg Hx   . Colon cancer Neg Hx    Social History   Tobacco Use  . Smoking status: Never Smoker  . Smokeless tobacco: Never Used  Substance Use Topics  . Alcohol use: No    Frequency: Never  . Drug use: No    ROS:  General: Negative for anorexia, weight loss, fever, chills, fatigue, weakness. ENT: Negative for hoarseness, difficulty swallowing , nasal congestion. CV: Negative for chest pain, angina, palpitations, dyspnea on exertion, peripheral edema.  Respiratory: Negative for dyspnea at rest, dyspnea on exertion, cough, sputum, wheezing.  GI: See history of present illness. GU:  Negative for dysuria, hematuria, urinary incontinence, urinary frequency, nocturnal urination.  Endo: Negative for unusual weight change.    Physical Examination:   BP 138/89   Pulse 89   Temp 97.7 F (36.5 C) (Oral)   Ht 5\' 4"  (1.626 m)   Wt 222 lb 9.6 oz (101 kg)   LMP 01/31/2018 (Exact Date)   BMI 38.21 kg/m   General: Well-nourished, well-developed in no acute distress.  Eyes: No icterus. Mouth: Oropharyngeal mucosa moist and  pink , no lesions erythema or exudate. Lungs: Clear to auscultation bilaterally.  Heart: Regular rate and rhythm, no murmurs rubs or gallops.  Abdomen: Bowel sounds are normal, nontender, nondistended, no hepatosplenomegaly or masses, no abdominal bruits or hernia , no rebound or guarding.   Extremities: No lower extremity edema. No clubbing or deformities. Neuro: Alert and oriented x 4   Skin: Warm and dry, no jaundice.   Psych: Alert and cooperative, normal mood and affect.

## 2018-02-07 NOTE — Progress Notes (Signed)
cc'd to pcp 

## 2018-02-24 ENCOUNTER — Telehealth: Payer: Self-pay | Admitting: Gastroenterology

## 2018-02-24 NOTE — Telephone Encounter (Signed)
Please let patient know that Dr. Jena Gaussrourk recommends colonoscopy to evaluate rectal bleeding.   Plan for deep sedation given polypharmacy.

## 2018-02-25 ENCOUNTER — Other Ambulatory Visit: Payer: Self-pay

## 2018-02-25 DIAGNOSIS — K625 Hemorrhage of anus and rectum: Secondary | ICD-10-CM

## 2018-02-25 MED ORDER — NA SULFATE-K SULFATE-MG SULF 17.5-3.13-1.6 GM/177ML PO SOLN: 1.0000 | ORAL | 0 refills | Status: DC

## 2018-02-25 NOTE — Telephone Encounter (Signed)
Pt notified of RMR recommendations, please schedule TCS.

## 2018-02-25 NOTE — Telephone Encounter (Signed)
Called pt, TCS w/Propofol w/RMR scheduled for 03/28/18 at 11:00am. Rx for prep sent to pharmacy. Will mail instructions after pre-op appt is scheduled.

## 2018-02-25 NOTE — Telephone Encounter (Signed)
Called and informed pt of pre-op appt 03/21/18 at 9:00am. Letter mailed with procedure instructions.

## 2018-02-25 NOTE — Addendum Note (Signed)
Addended by: Sandria Senter C on: 02/25/2018 10:22 AM   Modules accepted: Orders

## 2018-03-19 NOTE — Patient Instructions (Addendum)
   Your procedure is scheduled on: 03/28/2018  Report to [facility] at 8:45    AM.  Call this number if you have problems the morning of surgery: [phone]   Remember:              Follow Directions on the letter you received from Your Physician's office regarding the Bowel Prep  :  Take these medicines the morning of surgery with A SIP OF WATER: Wellbutrin, Zyrtec and Lexapro   Do not wear jewelry, make-up or nail polish.    Do not bring valuables to the hospital.  Contacts, dentures or bridgework may not be worn into surgery.  .   Patients discharged the day of surgery will not be allowed to drive home.     Colonoscopy, Adult, Care After This sheet gives you information about how to care for yourself after your procedure. Your health care provider may also give you more specific instructions. If you have problems or questions, contact your health care provider. What can I expect after the procedure? After the procedure, it is common to have:  A small amount of blood in your stool for 24 hours after the procedure.  Some gas.  Mild abdominal cramping or bloating.  Follow these instructions at home: General instructions   For the first 24 hours after the procedure: ? Do not drive or use machinery. ? Do not sign important documents. ? Do not drink alcohol. ? Do your regular daily activities at a slower pace than normal. ? Eat soft, easy-to-digest foods. ? Rest often.  Take over-the-counter or prescription medicines only as told by your health care provider.  It is up to you to get the results of your procedure. Ask your health care provider, or the department performing the procedure, when your results will be ready. Relieving cramping and bloating  Try walking around when you have cramps or feel bloated.  Apply heat to your abdomen as told by your health care provider. Use a heat source that your health care provider recommends, such as a moist heat pack or a heating  pad. ? Place a towel between your skin and the heat source. ? Leave the heat on for 20-30 minutes. ? Remove the heat if your skin turns bright red. This is especially important if you are unable to feel pain, heat, or cold. You may have a greater risk of getting burned. Eating and drinking  Drink enough fluid to keep your urine clear or pale yellow.  Resume your normal diet as instructed by your health care provider. Avoid heavy or fried foods that are hard to digest.  Avoid drinking alcohol for as long as instructed by your health care provider. Contact a health care provider if:  You have blood in your stool 2-3 days after the procedure. Get help right away if:  You have more than a small spotting of blood in your stool.  You pass large blood clots in your stool.  Your abdomen is swollen.  You have nausea or vomiting.  You have a fever.  You have increasing abdominal pain that is not relieved with medicine. This information is not intended to replace advice given to you by your health care provider. Make sure you discuss any questions you have with your health care provider. Document Released: 09/21/2003 Document Revised: 11/01/2015 Document Reviewed: 04/20/2015 Elsevier Interactive Patient Education  [employer].

## 2018-03-21 ENCOUNTER — Encounter (HOSPITAL_COMMUNITY): Payer: Self-pay

## 2018-03-21 ENCOUNTER — Encounter (HOSPITAL_COMMUNITY)
Admission: RE | Admit: 2018-03-21 | Discharge: 2018-03-21 | Disposition: A | Discharge status: Home / Self Care | Payer: BC Managed Care – PPO | Source: Ambulatory Visit | Attending: Internal Medicine | Admitting: Internal Medicine

## 2018-03-21 DIAGNOSIS — Z01812 Encounter for preprocedural laboratory examination: Secondary | ICD-10-CM | POA: Insufficient documentation

## 2018-03-21 HISTORY — DX: Other specified postprocedural states: Z98.890

## 2018-03-21 HISTORY — DX: Nausea with vomiting, unspecified: R11.2

## 2018-03-21 HISTORY — DX: Sleep apnea, unspecified: G47.30

## 2018-03-21 HISTORY — DX: Family history of other specified conditions: Z84.89

## 2018-03-21 LAB — PREGNANCY, URINE: Preg Test, Ur: NEGATIVE

## 2018-03-28 ENCOUNTER — Encounter (HOSPITAL_COMMUNITY): Payer: Self-pay | Admitting: *Deleted

## 2018-03-28 ENCOUNTER — Ambulatory Visit (HOSPITAL_COMMUNITY): Payer: BC Managed Care – PPO | Admitting: Anesthesiology

## 2018-03-28 ENCOUNTER — Encounter (HOSPITAL_COMMUNITY)
Admission: RE | Disposition: A | Discharge status: Home / Self Care | Payer: Self-pay | Source: Home / Self Care | Attending: Internal Medicine

## 2018-03-28 ENCOUNTER — Ambulatory Visit (HOSPITAL_COMMUNITY)
Admission: RE | Admit: 2018-03-28 | Discharge: 2018-03-28 | Disposition: A | Discharge status: Home / Self Care | Payer: BC Managed Care – PPO | Attending: Internal Medicine | Admitting: Internal Medicine

## 2018-03-28 DIAGNOSIS — G4730 Sleep apnea, unspecified: Secondary | ICD-10-CM | POA: Diagnosis not present

## 2018-03-28 DIAGNOSIS — Z880 Allergy status to penicillin: Secondary | ICD-10-CM | POA: Insufficient documentation

## 2018-03-28 DIAGNOSIS — K921 Melena: Secondary | ICD-10-CM | POA: Insufficient documentation

## 2018-03-28 DIAGNOSIS — F419 Anxiety disorder, unspecified: Secondary | ICD-10-CM | POA: Insufficient documentation

## 2018-03-28 DIAGNOSIS — Z8249 Family history of ischemic heart disease and other diseases of the circulatory system: Secondary | ICD-10-CM | POA: Insufficient documentation

## 2018-03-28 DIAGNOSIS — K640 First degree hemorrhoids: Secondary | ICD-10-CM | POA: Diagnosis not present

## 2018-03-28 DIAGNOSIS — Z79899 Other long term (current) drug therapy: Secondary | ICD-10-CM | POA: Insufficient documentation

## 2018-03-28 DIAGNOSIS — K625 Hemorrhage of anus and rectum: Secondary | ICD-10-CM

## 2018-03-28 DIAGNOSIS — K219 Gastro-esophageal reflux disease without esophagitis: Secondary | ICD-10-CM | POA: Diagnosis not present

## 2018-03-28 HISTORY — PX: COLONOSCOPY WITH PROPOFOL: SHX5780

## 2018-03-28 SURGERY — COLONOSCOPY WITH PROPOFOL
Anesthesia: Monitor Anesthesia Care

## 2018-03-28 MED ORDER — LACTATED RINGERS IV SOLN
INTRAVENOUS | Status: DC | PRN
  Administered 2018-03-28: 09:00:00 via INTRAVENOUS

## 2018-03-28 MED ORDER — MIDAZOLAM HCL 5 MG/5ML IJ SOLN
INTRAMUSCULAR | Status: DC | PRN
  Administered 2018-03-28: 2 mg via INTRAVENOUS

## 2018-03-28 MED ORDER — ONDANSETRON HCL 4 MG/2ML IJ SOLN
INTRAMUSCULAR | Status: AC
  Filled 2018-03-28: qty 6

## 2018-03-28 MED ORDER — PROPOFOL 10 MG/ML IV BOLUS
INTRAVENOUS | Status: AC
  Filled 2018-03-28: qty 40

## 2018-03-28 MED ORDER — CHLORHEXIDINE GLUCONATE CLOTH 2 % EX PADS: 6.0000 | MEDICATED_PAD | Freq: Once | CUTANEOUS | Status: DC

## 2018-03-28 MED ORDER — PROPOFOL 10 MG/ML IV BOLUS
INTRAVENOUS | Status: DC | PRN
  Administered 2018-03-28 (×2): 15 mg via INTRAVENOUS

## 2018-03-28 MED ORDER — LACTATED RINGERS IV SOLN
INTRAVENOUS | Status: DC
  Administered 2018-03-28: 1000 mL via INTRAVENOUS

## 2018-03-28 MED ORDER — MIDAZOLAM HCL 2 MG/2ML IJ SOLN
INTRAMUSCULAR | Status: AC
  Filled 2018-03-28: qty 2

## 2018-03-28 MED ORDER — PROPOFOL 500 MG/50ML IV EMUL
INTRAVENOUS | Status: DC | PRN
  Administered 2018-03-28: 10:00:00 via INTRAVENOUS
  Administered 2018-03-28: 125 ug/kg/min via INTRAVENOUS

## 2018-03-28 MED ORDER — ONDANSETRON HCL 4 MG/2ML IJ SOLN
INTRAMUSCULAR | Status: DC | PRN
  Administered 2018-03-28: 4 mg via INTRAVENOUS

## 2018-03-28 MED ORDER — PROPOFOL 10 MG/ML IV BOLUS
INTRAVENOUS | Status: AC
  Filled 2018-03-28: qty 20

## 2018-03-28 NOTE — Discharge Instructions (Signed)
Colonoscopy Discharge Instructions  Read the instructions outlined below and refer to this sheet in the next few weeks. These discharge instructions provide you with general information on caring for yourself after you leave the hospital. Your doctor may also give you specific instructions. While your treatment has been planned according to the most current medical practices available, unavoidable complications occasionally occur. If you have any problems or questions after discharge, call Dr. Jena Gauss at [phone]. ACTIVITY  You may resume your regular activity, but move at a slower pace for the next 24 hours.   Take frequent rest periods for the next 24 hours.   Walking will help get rid of the air and reduce the bloated feeling in your belly (abdomen).   No driving for 24 hours (because of the medicine (anesthesia) used during the test).    Do not sign any important legal documents or operate any machinery for 24 hours (because of the anesthesia used during the test).  NUTRITION  Drink plenty of fluids.   You may resume your normal diet as instructed by your doctor.   Begin with a light meal and progress to your normal diet. Heavy or fried foods are harder to digest and may make you feel sick to your stomach (nauseated).   Avoid alcoholic beverages for 24 hours or as instructed.  MEDICATIONS  You may resume your normal medications unless your doctor tells you otherwise.  WHAT YOU CAN EXPECT TODAY  Some feelings of bloating in the abdomen.   Passage of more gas than usual.   Spotting of blood in your stool or on the toilet paper.  IF YOU HAD POLYPS REMOVED DURING THE COLONOSCOPY:  No aspirin products for 7 days or as instructed.   No alcohol for 7 days or as instructed.   Eat a soft diet for the next 24 hours.  FINDING OUT THE RESULTS OF YOUR TEST Not all test results are available during your visit. If your test results are not back during the visit, make an appointment  with your caregiver to find out the results. Do not assume everything is normal if you have not heard from your caregiver or the medical facility. It is important for you to follow up on all of your test results.  SEEK IMMEDIATE MEDICAL ATTENTION IF:  You have more than a spotting of blood in your stool.   Your belly is swollen (abdominal distention).   You are nauseated or vomiting.   You have a temperature over 101.   You have abdominal pain or discomfort that is severe or gets worse throughout the day.    Continue Linzess 72 daily to every other day for management of constipation  Hemorrhoid information provided  Anusol HC cream as needed  Office visit with [place] in 3 months      Hemorrhoids Hemorrhoids are swollen veins in and around the rectum or anus. There are two types of hemorrhoids:  Internal hemorrhoids. These occur in the veins that are just inside the rectum. They may poke through to the outside and become irritated and painful.  External hemorrhoids. These occur in the veins that are outside the anus and can be felt as a painful swelling or hard lump near the anus. Most hemorrhoids do not cause serious problems, and they can be managed with home treatments such as diet and lifestyle changes. If home treatments do not help the symptoms, procedures can be done to shrink or remove the hemorrhoids. What are the causes? This  condition is caused by increased pressure in the anal area. This pressure may result from various things, including:  Constipation.  Straining to have a bowel movement.  Diarrhea.  Pregnancy.  Obesity.  Sitting for long periods of time.  Heavy lifting or other activity that causes you to strain.  Anal sex.  Riding a bike for a long period of time. What are the signs or symptoms? Symptoms of this condition include:  Pain.  Anal itching or irritation.  Rectal bleeding.  Leakage of stool (feces).  Anal swelling.  One or more  lumps around the anus. How is this diagnosed? This condition can often be diagnosed through a visual exam. Other exams or tests may also be done, such as:  An exam that involves feeling the rectal area with a gloved hand (digital rectal exam).  An exam of the anal canal that is done using a small tube (anoscope).  A blood test, if you have lost a significant amount of blood.  A test to look inside the colon using a flexible tube with a camera on the end (sigmoidoscopy or colonoscopy). How is this treated? This condition can usually be treated at home. However, various procedures may be done if dietary changes, lifestyle changes, and other home treatments do not help your symptoms. These procedures can help make the hemorrhoids smaller or remove them completely. Some of these procedures involve surgery, and others do not. Common procedures include:  Rubber band ligation. Rubber bands are placed at the base of the hemorrhoids to cut off their blood supply.  Sclerotherapy. Medicine is injected into the hemorrhoids to shrink them.  Infrared coagulation. A type of light energy is used to get rid of the hemorrhoids.  Hemorrhoidectomy surgery. The hemorrhoids are surgically removed, and the veins that supply them are tied off.  Stapled hemorrhoidopexy surgery. The surgeon staples the base of the hemorrhoid to the rectal wall. Follow these instructions at home: Eating and drinking   Eat foods that have a lot of fiber in them, such as whole grains, beans, nuts, fruits, and vegetables.  Ask your health care provider about taking products that have added fiber (fiber supplements).  Reduce the amount of fat in your diet. You can do this by eating low-fat dairy products, eating less red meat, and avoiding processed foods.  Drink enough fluid to keep your urine pale yellow. Managing pain and swelling   Take warm sitz baths for 20 minutes, 3-4 times a day to ease pain and discomfort. You may do  this in a bathtub or using a portable sitz bath that fits over the toilet.  If directed, apply ice to the affected area. Using ice packs between sitz baths may be helpful. ? Put ice in a plastic bag. ? Place a towel between your skin and the bag. ? Leave the ice on for 20 minutes, 2-3 times a day. General instructions  Take over-the-counter and prescription medicines only as told by your health care provider.  Use medicated creams or suppositories as told.  Get regular exercise. Ask your health care provider how much and what kind of exercise is best for you. In general, you should do moderate exercise for at least 30 minutes on most days of the week (150 minutes each week). This can include activities such as walking, biking, or yoga.  Go to the bathroom when you have the urge to have a bowel movement. Do not wait.  Avoid straining to have bowel movements.  Keep the  anal area dry and clean. Use wet toilet paper or moist towelettes after a bowel movement.  Do not sit on the toilet for long periods of time. This increases blood pooling and pain.  Keep all follow-up visits as told by your health care provider. This is important. Contact a health care provider if you have:  Increasing pain and swelling that are not controlled by treatment or medicine.  Difficulty having a bowel movement, or you are unable to have a bowel movement.  Pain or inflammation outside the area of the hemorrhoids. Get help right away if you have:  Uncontrolled bleeding from your rectum. Summary  Hemorrhoids are swollen veins in and around the rectum or anus.  Most hemorrhoids can be managed with home treatments such as diet and lifestyle changes.  Taking warm sitz baths can help ease pain and discomfort.  In severe cases, procedures or surgery can be done to shrink or remove the hemorrhoids. This information is not intended to replace advice given to you by your health care provider. Make sure you  discuss any questions you have with your health care provider. Document Released: 02/04/2000 Document Revised: 06/28/2017 Document Reviewed: 06/28/2017 Elsevier Interactive Patient Education  2019 [employer]     Monitored Anesthesia Care, Care After These instructions provide you with information about caring for yourself after your procedure. Your health care provider may also give you more specific instructions. Your treatment has been planned according to current medical practices, but problems sometimes occur. Call your health care provider if you have any problems or questions after your procedure. What can I expect after the procedure? After your procedure, you may:  Feel sleepy for several hours.  Feel clumsy and have poor balance for several hours.  Feel forgetful about what happened after the procedure.  Have poor judgment for several hours.  Feel nauseous or vomit.  Have a sore throat if you had a breathing tube during the procedure. Follow these instructions at home: For at least 24 hours after the procedure:      Have a responsible adult stay with you. It is important to have someone help care for you until you are awake and alert.  Rest as needed.  Do not: ? Participate in activities in which you could fall or become injured. ? Drive. ? Use heavy machinery. ? Drink alcohol. ? Take sleeping pills or medicines that cause drowsiness. ? Make important decisions or sign legal documents. ? Take care of children on your own. Eating and drinking  Follow the diet that is recommended by your health care provider.  If you vomit, drink water, juice, or soup when you can drink without vomiting.  Make sure you have little or no nausea before eating solid foods. General instructions  Take over-the-counter and prescription medicines only as told by your health care provider.  If you have sleep apnea, surgery and certain medicines can increase your risk for  breathing problems. Follow instructions from your health care provider about wearing your sleep device: ? Anytime you are sleeping, including during daytime naps. ? While taking prescription pain medicines, sleeping medicines, or medicines that make you drowsy.  If you smoke, do not smoke without supervision.  Keep all follow-up visits as told by your health care provider. This is important. Contact a health care provider if:  You keep feeling nauseous or you keep vomiting.  You feel light-headed.  You develop a rash.  You have a fever. Get help right away if:  You have trouble breathing. Summary  For several hours after your procedure, you may feel sleepy and have poor judgment.  Have a responsible adult stay with you for at least 24 hours or until you are awake and alert. This information is not intended to replace advice given to you by your health care provider. Make sure you discuss any questions you have with your health care provider. Document Released: 05/30/2015 Document Revised: 09/22/2016 Document Reviewed: 05/30/2015 Elsevier Interactive Patient Education  2019 [employer]

## 2018-03-28 NOTE — Anesthesia Postprocedure Evaluation (Signed)
Anesthesia Post Note  Patient: Jane Rivera  Procedure(s) Performed: COLONOSCOPY WITH PROPOFOL (N/A )  Patient location during evaluation: PACU Anesthesia Type: MAC Level of consciousness: awake and patient cooperative Pain management: pain level controlled Vital Signs Assessment: post-procedure vital signs reviewed and stable Respiratory status: spontaneous breathing, nonlabored ventilation and respiratory function stable Cardiovascular status: blood pressure returned to baseline Postop Assessment: no apparent nausea or vomiting Anesthetic complications: no     Last Vitals:  Vitals:   03/28/18 1030 03/28/18 1047  BP: (!) 83/34 124/77  Pulse: 74 83  Resp: 15 18  Temp:  36.7 C  SpO2: 93% 100%    Last Pain:  Vitals:   03/28/18 1047  TempSrc: Oral  PainSc: 0-No pain                 Lewis Keats J

## 2018-03-28 NOTE — Transfer of Care (Signed)
Immediate Anesthesia Transfer of Care Note  Patient: Jane Rivera  Procedure(s) Performed: COLONOSCOPY WITH PROPOFOL (N/A )  Patient Location: PACU  Anesthesia Type:MAC  Level of Consciousness: drowsy  Airway & Oxygen Therapy: Patient Spontanous Breathing  Post-op Assessment: Report given to RN and Post -op Vital signs reviewed and stable  Post vital signs: Reviewed and stable  Last Vitals:  Vitals Value Taken Time  BP    Temp    Pulse 83 03/28/2018 10:20 AM  Resp 17 03/28/2018 10:20 AM  SpO2 95 % 03/28/2018 10:20 AM  Vitals shown include unvalidated device data.  Last Pain:  Vitals:   03/28/18 1002  TempSrc:   PainSc: 0-No pain      Patients Stated Pain Goal: 5 (15/61/53 7943)  Complications: No apparent anesthesia complications

## 2018-03-28 NOTE — Op Note (Signed)
[facility] Patient Name: Jane Rivera Procedure Date: 03/28/2018 9:36 AM MRN: [id number] Date of Birth: [birth year] Attending MD: Gennette Pac , MD CSN: [id number] Age: 23 Admit Type: Outpatient Procedure:                Colonoscopy Indications:              Hematochezia Providers:                Gennette Pac, MD, Jannett Celestine, RN, Dyann Ruddle Referring MD:              Medicines:                Propofol per Anesthesia Complications:            No immediate complications. Estimated Blood Loss:     Estimated blood loss: none. Procedure:                Pre-Anesthesia Assessment:                           - Prior to the procedure, a History and Physical                            was performed, and patient medications and                            allergies were reviewed. The patient's tolerance of                            previous anesthesia was also reviewed. The risks                            and benefits of the procedure and the sedation                            options and risks were discussed with the patient.                            All questions were answered, and informed consent                            was obtained. Prior Anticoagulants: The patient has                            taken no previous anticoagulant or antiplatelet                            agents. ASA Grade Assessment: II - A patient with                            mild systemic disease. After reviewing the risks                            and benefits,  the patient was deemed in                            satisfactory condition to undergo the procedure.                           After obtaining informed consent, the colonoscope                            was passed under direct vision. Throughout the                            procedure, the patient's blood pressure, pulse, and                            oxygen saturations were monitored continuously. The                             CF-[id number] ([id number]) scope was introduced through                            the and advanced to the 5 cm into the ileum. The                            colonoscopy was performed without difficulty. The                            patient tolerated the procedure well. The quality                            of the bowel preparation was adequate. Scope In: 10:00:13 AM Scope Out: 10:12:18 AM Scope Withdrawal Time: 0 hours 6 minutes 59 seconds  Total Procedure Duration: 0 hours 12 minutes 5 seconds  Findings:      The perianal and digital rectal examinations were normal.      The colon (entire examined portion) appeared normal.      Internal hemorrhoids were found during retroflexion. The hemorrhoids       were moderate, medium-sized and Grade I (internal hemorrhoids that do       not prolapse).      The exam was otherwise without abnormality on direct and retroflexion       views. Impression:               - The entire examined colon is normal.                           - Internal hemorrhoids.                           - The examination was otherwise normal on direct                            and retroflexion views.                           - No specimens  collected. Moderate Sedation:      Moderate (conscious) sedation was personally administered by an       anesthesia professional. The following parameters were monitored: oxygen       saturation, heart rate, blood pressure, respiratory rate, EKG, adequacy       of pulmonary ventilation, and response to care.      Moderate (conscious) sedation was personally administered by an       anesthesia professional. The following parameters were monitored: oxygen       saturation, heart rate, blood pressure, respiratory rate, EKG, adequacy       of pulmonary ventilation, and response to care. Recommendation:           - Patient has a contact number available for                            emergencies. The signs and  symptoms of potential                            delayed complications were discussed with the                            patient. Return to normal activities tomorrow.                            Written discharge instructions were provided to the                            patient.                           - Resume previous diet.                           - Continue present medications. Continue Linzess 72                            daily to every other day. Anusol cream as needed                            for symptomatic hemorrhoids                           - Repeat colonoscopy at age 23 for screening                            purposes.                           - Return to GI office in 3 months. Procedure Code(s):        --- Professional ---                           [phone], Colonoscopy, flexible; diagnostic, including                            collection of specimen(s) by brushing or washing,  when performed (separate procedure) Diagnosis Code(s):        --- Professional ---                           K64.0, First degree hemorrhoids                           K92.1, Melena (includes Hematochezia) CPT copyright 2018 American Medical Association. All rights reserved. The codes documented in this report are preliminary and upon coder review may  be revised to meet current compliance requirements. Jane Rivera. Jane Shambley, MD Gennette Pac, MD 03/28/2018 10:29:51 AM This report has been signed electronically. Number of Addenda: 0

## 2018-03-28 NOTE — Anesthesia Preprocedure Evaluation (Signed)
Anesthesia Evaluation  Patient identified by MRN, date of birth, ID band Patient awake    Reviewed: Allergy & Precautions, H&P , NPO status , Patient's Chart, lab work & pertinent test results, reviewed documented beta blocker date and time   History of Anesthesia Complications (+) PONV and history of anesthetic complications  Airway Mallampati: II  TM Distance: >3 FB Neck ROM: full    Dental no notable dental hx.    Pulmonary sleep apnea ,    Pulmonary exam normal breath sounds clear to auscultation       Cardiovascular Exercise Tolerance: Good negative cardio ROS   Rhythm:regular Rate:Normal     Neuro/Psych Anxiety negative neurological ROS  negative psych ROS   GI/Hepatic Neg liver ROS, GERD  ,  Endo/Other  negative endocrine ROS  Renal/GU negative Renal ROS  negative genitourinary   Musculoskeletal   Abdominal   Peds  Hematology negative hematology ROS (+)   Anesthesia Other Findings   Reproductive/Obstetrics negative OB ROS                             Anesthesia Physical Anesthesia Plan  ASA: II  Anesthesia Plan: MAC   Post-op Pain Management:    Induction:   PONV Risk Score and Plan:   Airway Management Planned:   Additional Equipment:   Intra-op Plan:   Post-operative Plan:   Informed Consent: I have reviewed the patients History and Physical, chart, labs and discussed the procedure including the risks, benefits and alternatives for the proposed anesthesia with the patient or authorized representative who has indicated his/her understanding and acceptance.     Dental Advisory Given  Plan Discussed with: CRNA  Anesthesia Plan Comments:         Anesthesia Quick Evaluation

## 2018-03-28 NOTE — H&P (Signed)
@LOGO @   Primary Care Physician:  Assunta Found, MD Primary Gastroenterologist:  Dr. Jena Gauss  Pre-Procedure History & Physical: HPI:  Jane Rivera is a 23 y.o. female here for diagnostic colonoscopy.  Has IBS-C.  Taking Linzess 72 daily to every other day for bowel movements 1-2 times daily.  See small amounts of blood on occasion.  Past Medical History:  Diagnosis Date  . Anxiety   . Family history of adverse reaction to anesthesia    Severe PONV  . PONV (postoperative nausea and vomiting)   . Rash    controlled with zyrtec  . Sleep apnea     Past Surgical History:  Procedure Laterality Date  . WISDOM TOOTH EXTRACTION      Prior to Admission medications   Medication Sig Start Date End Date Taking? Authorizing Provider  buPROPion (WELLBUTRIN XL) 300 MG 24 hr tablet Take 1 tablet by mouth daily. 07/23/17  Yes [provider]  cetirizine (ZYRTEC) 10 MG tablet Take 10 mg by mouth daily.   Yes [provider]  doxycycline (VIBRAMYCIN) 100 MG capsule Take 100 mg by mouth daily.   Yes [provider]  escitalopram (LEXAPRO) 20 MG tablet Take 1 tablet by mouth daily. 05/27/17  Yes [provider]  linaclotide Karlene Einstein) 72 MCG capsule Take 1 capsule (72 mcg total) by mouth daily before breakfast. 08/14/17  Yes Tiffany Kocher, PA-C  TRI-SPRINTEC 0.18/0.215/0.25 MG-35 MCG tablet Take 1 tablet by mouth daily. 06/13/17  Yes [provider]    Allergies as of 02/25/2018 - Review Complete 02/06/2018  Allergen Reaction Noted  . Amoxicillin  07/24/2017    Family History  Problem Relation Age of Onset  . Hypertension Mother   . Hypertension Father   . Inflammatory bowel disease Neg Hx   . Celiac disease Neg Hx   . Colon cancer Neg Hx     Social History   Socioeconomic History  . Marital status: Single    Spouse name: Not on file  . Number of children: Not on file  . Years of education: Not on file  . Highest education level: Not on file   Occupational History  . Not on file  Social Needs  . Financial resource strain: Not on file  . Food insecurity:    Worry: Not on file    Inability: Not on file  . Transportation needs:    Medical: Not on file    Non-medical: Not on file  Tobacco Use  . Smoking status: Never Smoker  . Smokeless tobacco: Never Used  Substance and Sexual Activity  . Alcohol use: No    Frequency: Never  . Drug use: No  . Sexual activity: Not Currently    Birth control/protection: Pill  Lifestyle  . Physical activity:    Days per week: Not on file    Minutes per session: Not on file  . Stress: Not on file  Relationships  . Social connections:    Talks on phone: Not on file    Gets together: Not on file    Attends religious service: Not on file    Active member of club or organization: Not on file    Attends meetings of clubs or organizations: Not on file    Relationship status: Not on file  . Intimate partner violence:    Fear of current or ex partner: Not on file    Emotionally abused: Not on file    Physically abused: Not on file  Forced sexual activity: Not on file  Other Topics Concern  . Not on file  Social History Narrative   Graduated from [employer].    Review of Systems: See HPI, otherwise negative ROS  Physical Exam: BP 136/90   Pulse 96   Temp 98.9 F (37.2 C) (Oral)   Resp 18   LMP 03/28/2018   SpO2 95%  General:   Alert,  Well-developed, well-nourished, pleasant and cooperative in NAD Neck:  Supple; no masses or thyromegaly. No significant cervical adenopathy. Lungs:  Clear throughout to auscultation.   No wheezes, crackles, or rhonchi. No acute distress. Heart:  Regular rate and rhythm; no murmurs, clicks, rubs,  or gallops. Abdomen: Non-distended, normal bowel sounds.  Soft and nontender without appreciable mass or hepatosplenomegaly.  Pulses:  Normal pulses noted. Extremities:  Without clubbing or edema.  Impression/Plan: Pleasant 23 year old lady  here for colonoscopy to further evaluate rectal bleeding.  The risks, benefits, limitations, alternatives and imponderables have been reviewed with the patient. Questions have been answered. All parties are agreeable.      Notice: This dictation was prepared with Dragon dictation along with smaller phrase technology. Any transcriptional errors that result from this process are unintentional and may not be corrected upon review.

## 2018-03-29 ENCOUNTER — Telehealth: Payer: Self-pay | Admitting: Internal Medicine

## 2018-03-29 NOTE — Telephone Encounter (Signed)
Noted. Pt aware of RMR recommendations and will go to the ED if needed.

## 2018-03-29 NOTE — Telephone Encounter (Signed)
Communication noted.  Her hemorrhoids got likely aggravated a bit from the procedure.  But no biopsies/polypectomies performed.  I do not expect any significant bleeding from a diagnostic colonoscopy.  Will likely settle down.

## 2018-03-29 NOTE — Telephone Encounter (Signed)
[phone] PLEASE CALL PATIENT, HAD PROCEDURE YESTERDAY AND SHE IS HAVING RECTAL BLEEDING, SHE STATED IT WAS A LOT

## 2018-03-29 NOTE — Telephone Encounter (Signed)
Spoke with pt s/p her TCS which was yesterday 03/28/2018. She noticed a little bleeding last night but wasn't sure due to her menstrual cycle. She had a bowel movement this morning without straining and noticed a little less than a teaspoon of blood in the toilet. Pt wipe prior to having the bowel movement to make sure it was coming from the rectum. Pt was advised to go to the ED if she notices severe bleeding, feels light headed or weak. Please advise.

## 2018-04-03 ENCOUNTER — Encounter (HOSPITAL_COMMUNITY): Payer: Self-pay | Admitting: Internal Medicine

## 2018-06-26 ENCOUNTER — Ambulatory Visit (INDEPENDENT_AMBULATORY_CARE_PROVIDER_SITE_OTHER): Payer: BC Managed Care – PPO | Admitting: Gastroenterology

## 2018-06-26 ENCOUNTER — Other Ambulatory Visit: Payer: Self-pay

## 2018-06-26 ENCOUNTER — Encounter: Payer: Self-pay | Admitting: Gastroenterology

## 2018-06-26 DIAGNOSIS — K581 Irritable bowel syndrome with constipation: Secondary | ICD-10-CM

## 2018-06-26 NOTE — Progress Notes (Signed)
Primary Care Physician:  Assunta Found, MD Primary GI:  Roetta Sessions, MD   Patient Location: Home  Provider Location: [facility] office  Reason for Visit: IBS  Persons present on the virtual encounter, with roles: Patient, myself (provider), Carver Fila CMA (updated meds and allergies)  Total time (minutes) spent on medical discussion: 10 minutes  Due to COVID-19, visit was conducted using [email or web address] method.  Visit was requested by patient.  Virtual Visit via [email or web address]  I connected with Jane Rivera on 06/26/18 at 11:00 AM EDT by [email or web address] and verified that I am speaking with the correct person using two identifiers.   I discussed the limitations, risks, security and privacy concerns of performing an evaluation and management service by telephone/video and the availability of in person appointments. I also discussed with the patient that there may be a patient responsible charge related to this service. The patient expressed understanding and agreed to proceed.   HPI:   Jane Rivera is a 23 y.o. female who presents for virtual visit regarding IBS.  She was seen back in December 19.  She has a history of IBS, constipation but with intermittent diarrhea.  has done well on Linzess 72 mcg daily.  Celiac screen, thyroid function previously normal.  She had a colonoscopy after her last office visit due to bright red blood per rectum.  Colonoscopy in February showed internal hemorrhoids.  Otherwise unremarkable exam.  Next colonoscopy at age 48.  Overall patient's been doing okay.  She has been taking Linzess 72 mcg every other day.  She takes about 30 minutes before breakfast and will often have to get up in the middle of breakfast and to have a bowel movement.  We will have 1-2 loose stools.  No melena.  Rare bright red blood per rectum on the tissue.  No abdominal pain.  No other complaints.  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  Has never tried Amitiza or Trulance.    Current  Outpatient Medications  Medication Sig Dispense Refill  . buPROPion (WELLBUTRIN XL) 300 MG 24 hr tablet Take 1 tablet by mouth daily.    . cetirizine (ZYRTEC) 10 MG tablet Take 10 mg by mouth daily.    Marland Kitchen doxycycline (VIBRAMYCIN) 100 MG capsule Take 100 mg by mouth daily.    Marland Kitchen escitalopram (LEXAPRO) 20 MG tablet Take 1 tablet by mouth daily.  3  . linaclotide (LINZESS) 72 MCG capsule Take 1 capsule (72 mcg total) by mouth daily before breakfast. 30 capsule 11  . TRI-SPRINTEC 0.18/0.215/0.25 MG-35 MCG tablet Take 1 tablet by mouth daily.  2   No current facility-administered medications for this visit.     ROS:  General: Negative for anorexia, weight loss, fever, chills, fatigue, weakness. Eyes: Negative for vision changes.  ENT: Negative for hoarseness, difficulty swallowing , nasal congestion. CV: Negative for chest pain, angina, palpitations, dyspnea on exertion, peripheral edema.  Respiratory: Negative for dyspnea at rest, dyspnea on exertion, cough, sputum, wheezing.  GI: See history of present illness. GU:  Negative for dysuria, hematuria, urinary incontinence, urinary frequency, nocturnal urination.  MS: Negative for joint pain, low back pain.  Derm: Negative for rash or itching.  Neuro: Negative for weakness, abnormal sensation, seizure, frequent headaches, memory loss, confusion.  Psych: Negative for anxiety, depression, suicidal ideation, hallucinations.  Endo: Negative for unusual weight change.  Heme: Negative for bruising or bleeding. Allergy: Negative for rash or hives.   Observations/Objective: Pleasant well-nourished well-developed female in  no acute distress.  Otherwise exam unavailable.  Assessment and Plan: Pleasant 23 year old female with history of IBS-constipation.  For the most part doing well on Linzess.  Developed some urgency shortly after taking the medication.  Currently taking Linzess 30 minutes before breakfast.  Encouraged her to take it at least an hour  before breakfast to see if this urgency improves.  If not she can let me know we can try 1 of the other agents such as Amitiza or trulance.  We will plan to see her back in a year.  Call sooner if needed.  Follow Up Instructions:    I discussed the assessment and treatment plan with the patient. The patient was provided an opportunity to ask questions and all were answered. The patient agreed with the plan and demonstrated an understanding of the instructions. AVS mailed to patient's home address.   The patient was advised to call back or seek an in-person evaluation if the symptoms worsen or if the condition fails to improve as anticipated.  I provided 10 minutes of virtual face-to-face time during this encounter.   Tana CoastLeslie Rhylan Kagel, PA-C

## 2018-06-26 NOTE — Patient Instructions (Signed)
1. Try taking Linzess 1 hour before breakfast to see if this cuts back on urgency for bowel movement.  If it does not really make a difference, please let me know.  If needed, we could switch you to Amitiza or Trulance.  2. We will plan to see you back in 1 year, please call sooner if you have any problems.

## 2018-06-26 NOTE — Progress Notes (Signed)
CC'ED TO PCP 

## 2018-09-25 ENCOUNTER — Telehealth: Payer: Self-pay | Admitting: Internal Medicine

## 2018-09-25 NOTE — Telephone Encounter (Signed)
Pt called to say that she had seen LSL back in May and her reflux hasn't gotten any better and could LSL call a prescription into [facility] on [address]. [phone]

## 2018-09-25 NOTE — Telephone Encounter (Signed)
Lmom, waiting on a return call.  

## 2018-09-27 NOTE — Telephone Encounter (Signed)
Spoke with pt. At pt's last ov, she said the plan was to see how she did with the gerd symptoms. The gerd symptoms didn't improve. Pt is having burning sensations, heart burn. Pt tried a trial of OTC Prilosec for 2 weeks and it improved her gerd. Pt wants to do an Rx now.

## 2018-09-30 MED ORDER — OMEPRAZOLE 20 MG PO CPDR: 20.0000 mg | DELAYED_RELEASE_CAPSULE | Freq: Every day | ORAL | 1 refills | Status: DC

## 2018-09-30 NOTE — Addendum Note (Signed)
Addended by: Mahala Menghini on: 09/30/2018 05:18 PM   Modules accepted: Orders

## 2018-09-30 NOTE — Telephone Encounter (Signed)
RX done. 

## 2018-10-01 NOTE — Telephone Encounter (Signed)
PT is aware.

## 2018-11-26 DIAGNOSIS — L700 Acne vulgaris: Secondary | ICD-10-CM | POA: Diagnosis not present

## 2018-12-03 ENCOUNTER — Other Ambulatory Visit: Payer: Self-pay

## 2018-12-03 ENCOUNTER — Ambulatory Visit: Payer: BC Managed Care – PPO | Admitting: Internal Medicine

## 2018-12-03 ENCOUNTER — Encounter: Payer: Self-pay | Admitting: Internal Medicine

## 2018-12-03 VITALS — BP 145/98 | HR 106 | Temp 96.2°F | Ht 64.0 in | Wt 216.2 lb

## 2018-12-03 DIAGNOSIS — K648 Other hemorrhoids: Secondary | ICD-10-CM

## 2018-12-03 DIAGNOSIS — K581 Irritable bowel syndrome with constipation: Secondary | ICD-10-CM | POA: Diagnosis not present

## 2018-12-03 MED ORDER — HYDROCORTISONE (PERIANAL) 2.5 % EX CREA: 1.0000 "application " | TOPICAL_CREAM | Freq: Three times a day (TID) | CUTANEOUS | 0 refills | Status: AC

## 2018-12-03 NOTE — Patient Instructions (Signed)
  Hemorrhoid and hemorrhoid banding information provided  Benefiber 1 tablespoon daily  Anusol HC cream - disp I unit - Apply a pea-sized amount to the ano-rectum 3 x daily - no refils  Call us in 1 week and let us know how you are doing  Continue Linzess 72 - 1 capsule every other day.

## 2018-12-03 NOTE — Progress Notes (Signed)
Primary Care Physician:  Sharilyn Sites, MD Primary Gastroenterologist:  Dr. Gala Romney  Pre-Procedure History & Physical: HPI:  Jane Rivera is a 23 y.o. female here for follow-up of rectal bleeding.  History of IBS-C.  Colonoscopy demonstrated grade 1 hemorrhoids recently without any other findings.  She has been on Linzess 72 daily which she felt was too much - dropped back to 1 every other day;  has a bowel movement every day.  A little loose after she takes the dose of Linzess,  but it is managing her constipation very well.  No further rectal bleeding.  Complains of a hemorrhoid protruding for the past 1 week.  Minimally painful. GERD well-controlled on omeprazole 20 mg daily.  Past Medical History:  Diagnosis Date  . Anxiety   . Family history of adverse reaction to anesthesia    Severe PONV  . PONV (postoperative nausea and vomiting)   . Rash    controlled with zyrtec  . Sleep apnea     Past Surgical History:  Procedure Laterality Date  . COLONOSCOPY WITH PROPOFOL N/A 03/28/2018   Dr. Gala Romney: Internal hemorrhoids.  Next colonoscopy age 37  . WISDOM TOOTH EXTRACTION      Prior to Admission medications   Medication Sig Start Date End Date Taking? Authorizing Provider  buPROPion (WELLBUTRIN XL) 300 MG 24 hr tablet Take 1 tablet by mouth daily. 07/23/17  Yes [provider]  cetirizine (ZYRTEC) 10 MG tablet Take 10 mg by mouth daily.   Yes [provider]  escitalopram (LEXAPRO) 20 MG tablet Take 1 tablet by mouth daily. 05/27/17  Yes [provider]  linaclotide Rolan Lipa) 72 MCG capsule Take 1 capsule (72 mcg total) by mouth daily before breakfast. 08/14/17  Yes Mahala Menghini, PA-C  minocycline (MINOCIN) 100 MG capsule Take 100 mg by mouth daily.   Yes [provider]  omeprazole (PRILOSEC) 20 MG capsule Take 1 capsule (20 mg total) by mouth daily before breakfast. 09/30/18  Yes Mahala Menghini, PA-C  TRI-SPRINTEC 0.18/0.215/0.25 MG-35 MCG tablet  Take 1 tablet by mouth daily. 06/13/17  Yes [provider]    Allergies as of 12/03/2018 - Review Complete 12/03/2018  Allergen Reaction Noted  . Amoxicillin  07/24/2017    Family History  Problem Relation Age of Onset  . Hypertension Mother   . Hypertension Father   . Inflammatory bowel disease Neg Hx   . Celiac disease Neg Hx   . Colon cancer Neg Hx     Social History   Socioeconomic History  . Marital status: Single    Spouse name: Not on file  . Number of children: Not on file  . Years of education: Not on file  . Highest education level: Not on file  Occupational History  . Not on file  Social Needs  . Financial resource strain: Not on file  . Food insecurity    Worry: Not on file    Inability: Not on file  . Transportation needs    Medical: Not on file    Non-medical: Not on file  Tobacco Use  . Smoking status: Never Smoker  . Smokeless tobacco: Never Used  Substance and Sexual Activity  . Alcohol use: No    Frequency: Never  . Drug use: No  . Sexual activity: Not Currently    Birth control/protection: Pill  Lifestyle  . Physical activity    Days per week: Not on file    Minutes per session: Not  on file  . Stress: Not on file  Relationships  . Social Musician on phone: Not on file    Gets together: Not on file    Attends religious service: Not on file    Active member of club or organization: Not on file    Attends meetings of clubs or organizations: Not on file    Relationship status: Not on file  . Intimate partner violence    Fear of current or ex partner: Not on file    Emotionally abused: Not on file    Physically abused: Not on file    Forced sexual activity: Not on file  Other Topics Concern  . Not on file  Social History Narrative   Graduated from [employer].    Review of Systems: See HPI, otherwise negative ROS  Physical Exam: BP (!) 145/98   Pulse (!) 106   Temp (!) 96.2 F (35.7 C) (Oral)   Ht 5'  4" (1.626 m)   Wt 216 lb 3.2 oz (98.1 kg)   LMP 11/01/2018 (Approximate)   BMI 37.11 kg/m  General:   Alert,  Well-developed, well-nourished, pleasant and cooperative in NAD Abdomen: Non-distended, normal bowel sounds.  Soft and nontender without appreciable mass or hepatosplenomegaly.  Pulses:  Normal pulses noted. Extremities:  Without clubbing or edema. Rectal: Small pea-sized hemorrhoid in the anal canal slightly firm.  Not grossly thrombosed.  It is reducible.  She is tender in this area.  At the 4 o'clock position.  Impression/Plan: 23 year old lady with IBS-C -  recent colonoscopy reassuring.  She does have a grade 2 hemorrhoid urrently. We had a lengthy discussion about hemorrhoid management as well as the chronic, incurable nature of irritable bowel syndrome. She is doing well currently on Linzess 72 every other day.  She may or may not need hemorrhoid banding.  Recommendations:   Hemorrhoid and hemorrhoid banding information provided  Benefiber 1 tablespoon daily  Anusol HC cream - disp I unit - Apply a pea-sized amount to the ano-rectum 3 x daily - no refils  Call us in 1 week and let us know how you are doing and we will go from there  Continue Linzess 72 - 1 capsule every other day.       Notice: This dictation was prepared with Dragon dictation along with smaller phrase technology. Any transcriptional errors that result from this process are unintentional and may not be corrected upon review.

## 2018-12-11 ENCOUNTER — Telehealth: Payer: Self-pay | Admitting: Internal Medicine

## 2018-12-11 NOTE — Telephone Encounter (Signed)
Communication noted.  

## 2018-12-11 NOTE — Telephone Encounter (Signed)
Pt called with c/o itching where her hemorrhoid is. Pt still has Anusol cr that's being applied to the Hemorrhoid. Pt isn't having any pain and reports only mild itching. Pt is going to discuss with her family about the hemorrhoid banding and call back in 1 week if she decides to schedule  Banding apt.

## 2018-12-11 NOTE — Telephone Encounter (Signed)
[phone] patient called with an update from last visit.  She has gotten little better but the hemorrhoid is still out and itchy

## 2019-02-24 ENCOUNTER — Other Ambulatory Visit: Payer: Self-pay | Admitting: Gastroenterology

## 2019-03-24 ENCOUNTER — Other Ambulatory Visit: Payer: Self-pay | Admitting: Gastroenterology

## 2019-05-07 DIAGNOSIS — F320 Major depressive disorder, single episode, mild: Secondary | ICD-10-CM | POA: Diagnosis not present

## 2019-05-07 DIAGNOSIS — F419 Anxiety disorder, unspecified: Secondary | ICD-10-CM | POA: Diagnosis not present

## 2019-06-05 ENCOUNTER — Encounter: Payer: Self-pay | Admitting: Internal Medicine

## 2019-07-17 DIAGNOSIS — Z23 Encounter for immunization: Secondary | ICD-10-CM | POA: Diagnosis not present

## 2019-08-08 DIAGNOSIS — Z23 Encounter for immunization: Secondary | ICD-10-CM | POA: Diagnosis not present

## 2019-09-01 ENCOUNTER — Ambulatory Visit (INDEPENDENT_AMBULATORY_CARE_PROVIDER_SITE_OTHER): Payer: BLUE CROSS/BLUE SHIELD | Admitting: Gastroenterology

## 2019-09-01 ENCOUNTER — Other Ambulatory Visit: Payer: Self-pay

## 2019-09-01 ENCOUNTER — Encounter: Payer: Self-pay | Admitting: Gastroenterology

## 2019-09-01 VITALS — BP 150/98 | HR 85 | Temp 98.4°F | Ht 64.0 in | Wt 220.8 lb

## 2019-09-01 DIAGNOSIS — K582 Mixed irritable bowel syndrome: Secondary | ICD-10-CM

## 2019-09-01 DIAGNOSIS — K219 Gastro-esophageal reflux disease without esophagitis: Secondary | ICD-10-CM

## 2019-09-01 NOTE — Patient Instructions (Signed)
1. Try weaning of omeprazole over the next couple of weeks. Start with taking every other day this week. Next week try taking 2-3 times weekly. If tolerated, then stop completely. You can use OTC TUMS or Pepcid 20mg  twice daily as needed.  2. Call if few weeks and let me know how things are going. If you nausea gets worse or you start having a lot of heartburn again, we will likely need to get you back on omeprazole.  3. Return to the office in one year or sooner if needed.    Food Choices for Gastroesophageal Reflux Disease, Adult When you have gastroesophageal reflux disease (GERD), the foods you eat and your eating habits are very important. Choosing the right foods can help ease the discomfort of GERD. Consider working with a diet and nutrition specialist (dietitian) to help you make healthy food choices. What general guidelines should I follow?  Eating plan  Choose healthy foods low in fat, such as fruits, vegetables, whole grains, low-fat dairy products, and lean meat, fish, and poultry.  Eat frequent, small meals instead of three large meals each day. Eat your meals slowly, in a relaxed setting. Avoid bending over or lying down until 2-3 hours after eating.  Limit high-fat foods such as fatty meats or fried foods.  Limit your intake of oils, butter, and shortening to less than 8 teaspoons each day.  Avoid the following: ? Foods that cause symptoms. These may be different for different people. Keep a food diary to keep track of foods that cause symptoms. ? Alcohol. ? Drinking large amounts of liquid with meals. ? Eating meals during the 2-3 hours before bed.  Cook foods using methods other than frying. This may include baking, grilling, or broiling. Lifestyle  Maintain a healthy weight. Ask your health care provider what weight is healthy for you. If you need to lose weight, work with your health care provider to do so safely.  Exercise for at least 30 minutes on 5 or more days  each week, or as told by your health care provider.  Avoid wearing clothes that fit tightly around your waist and chest.  Do not use any products that contain nicotine or tobacco, such as cigarettes and e-cigarettes. If you need help quitting, ask your health care provider.  Sleep with the head of your bed raised. Use a wedge under the mattress or blocks under the bed frame to raise the head of the bed. What foods are not recommended? The items listed may not be a complete list. Talk with your dietitian about what dietary choices are best for you. Grains Pastries or quick breads with added fat. toast. Vegetables Deep fried vegetables. [place] fries. Any vegetables prepared with added fat. Any vegetables that cause symptoms. For some people this may include tomatoes and tomato products, chili peppers, onions and garlic, and horseradish. Fruits Any fruits prepared with added fat. Any fruits that cause symptoms. For some people this may include citrus fruits, such as oranges, grapefruit, pineapple, and lemons. Meats and other protein foods High-fat meats, such as fatty beef or pork, hot dogs, ribs, ham, sausage, salami and bacon. Fried meat or protein, including fried fish and fried chicken. Nuts and nut butters. Dairy Whole milk and chocolate milk. Sour cream. Cream. Ice cream. Cream cheese. Milk shakes. Beverages Coffee and tea, with or without caffeine. Carbonated beverages. Sodas. Energy drinks. Fruit juice made with acidic fruits (such as orange or grapefruit). Tomato juice. Alcoholic drinks. Fats and oils  Butter. Margarine. Shortening. Ghee. Sweets and desserts Chocolate and cocoa. Donuts. Seasoning and other foods Pepper. Peppermint and spearmint. Any condiments, herbs, or seasonings that cause symptoms. For some people, this may include curry, hot sauce, or vinegar-based salad dressings. Summary  When you have gastroesophageal reflux disease (GERD), food and lifestyle choices  are very important to help ease the discomfort of GERD.  Eat frequent, small meals instead of three large meals each day. Eat your meals slowly, in a relaxed setting. Avoid bending over or lying down until 2-3 hours after eating.  Limit high-fat foods such as fatty meat or fried foods. This information is not intended to replace advice given to you by your health care provider. Make sure you discuss any questions you have with your health care provider. Document Revised: 05/30/2018 Document Reviewed: 02/08/2016 Elsevier Patient Education  2020 [employer]   Gastroesophageal Reflux Disease, Adult Gastroesophageal reflux (GER) happens when acid from the stomach flows up into the tube that connects the mouth and the stomach (esophagus). Normally, food travels down the esophagus and stays in the stomach to be digested. However, when a person has GER, food and stomach acid sometimes move back up into the esophagus. If this becomes a more serious problem, the person may be diagnosed with a disease called gastroesophageal reflux disease (GERD). GERD occurs when the reflux:  Happens often.  Causes frequent or severe symptoms.  Causes problems such as damage to the esophagus. When stomach acid comes in contact with the esophagus, the acid may cause soreness (inflammation) in the esophagus. Over time, GERD may create small holes (ulcers) in the lining of the esophagus. What are the causes? This condition is caused by a problem with the muscle between the esophagus and the stomach (lower esophageal sphincter, or LES). Normally, the LES muscle closes after food passes through the esophagus to the stomach. When the LES is weakened or abnormal, it does not close properly, and that allows food and stomach acid to go back up into the esophagus. The LES can be weakened by certain dietary substances, medicines, and medical conditions, including:  Tobacco use.  Pregnancy.  Having a hiatal  hernia.  Alcohol use.  Certain foods and beverages, such as coffee, chocolate, onions, and peppermint. What increases the risk? You are more likely to develop this condition if you:  Have an increased body weight.  Have a connective tissue disorder.  Use NSAID medicines. What are the signs or symptoms? Symptoms of this condition include:  Heartburn.  Difficult or painful swallowing.  The feeling of having a lump in the throat.  Abitter taste in the mouth.  Bad breath.  Having a large amount of saliva.  Having an upset or bloated stomach.  Belching.  Chest pain. Different conditions can cause chest pain. Make sure you see your health care provider if you experience chest pain.  Shortness of breath or wheezing.  Ongoing (chronic) cough or a night-time cough.  Wearing away of tooth enamel.  Weight loss. How is this diagnosed? Your health care provider will take a medical history and perform a physical exam. To determine if you have mild or severe GERD, your health care provider may also monitor how you respond to treatment. You may also have tests, including:  A test to examine your stomach and esophagus with a small camera (endoscopy).  A test thatmeasures the acidity level in your esophagus.  A test thatmeasures how much pressure is on your esophagus.  A barium swallow or  modified barium swallow test to show the shape, size, and functioning of your esophagus. How is this treated? The goal of treatment is to help relieve your symptoms and to prevent complications. Treatment for this condition may vary depending on how severe your symptoms are. Your health care provider may recommend:  Changes to your diet.  Medicine.  Surgery. Follow these instructions at home: Eating and drinking   Follow a diet as recommended by your health care provider. This may involve avoiding foods and drinks such as: ? Coffee and tea (with or without caffeine). ? Drinks that  containalcohol. ? Energy drinks and sports drinks. ? Carbonated drinks or sodas. ? Chocolate and cocoa. ? Peppermint and mint flavorings. ? Garlic and onions. ? Horseradish. ? Spicy and acidic foods, including peppers, chili powder, curry powder, vinegar, hot sauces, and barbecue sauce. ? Citrus fruit juices and citrus fruits, such as oranges, lemons, and limes. ? Tomato-based foods, such as red sauce, chili, salsa, and pizza with red sauce. ? Fried and fatty foods, such as donuts, french fries, potato chips, and high-fat dressings. ? High-fat meats, such as hot dogs and fatty cuts of red and white meats, such as rib eye steak, sausage, ham, and bacon. ? High-fat dairy items, such as whole milk, butter, and cream cheese.  Eat small, frequent meals instead of large meals.  Avoid drinking large amounts of liquid with your meals.  Avoid eating meals during the 2-3 hours before bedtime.  Avoid lying down right after you eat.  Do not exercise right after you eat. Lifestyle   Do not use any products that contain nicotine or tobacco, such as cigarettes, e-cigarettes, and chewing tobacco. If you need help quitting, ask your health care provider.  Try to reduce your stress by using methods such as yoga or meditation. If you need help reducing stress, ask your health care provider.  If you are overweight, reduce your weight to an amount that is healthy for you. Ask your health care provider for guidance about a safe weight loss goal. General instructions  Pay attention to any changes in your symptoms.  Take over-the-counter and prescription medicines only as told by your health care provider. Do not take aspirin, ibuprofen, or other NSAIDs unless your health care provider told you to do so.  Wear loose-fitting clothing. Do not wear anything tight around your waist that causes pressure on your abdomen.  Raise (elevate) the head of your bed about 6 inches (15 cm).  Avoid bending over if  this makes your symptoms worse.  Keep all follow-up visits as told by your health care provider. This is important. Contact a health care provider if:  You have: ? New symptoms. ? Unexplained weight loss. ? Difficulty swallowing or it hurts to swallow. ? Wheezing or a persistent cough. ? A hoarse voice.  Your symptoms do not improve with treatment. Get help right away if you:  Have pain in your arms, neck, jaw, teeth, or back.  Feel sweaty, dizzy, or light-headed.  Have chest pain or shortness of breath.  Vomit and your vomit looks like blood or coffee grounds.  Faint.  Have stool that is bloody or black.  Cannot swallow, drink, or eat. Summary  Gastroesophageal reflux happens when acid from the stomach flows up into the esophagus. GERD is a disease in which the reflux happens often, causes frequent or severe symptoms, or causes problems such as damage to the esophagus.  Treatment for this condition may vary depending on  how severe your symptoms are. Your health care provider may recommend diet and lifestyle changes, medicine, or surgery.  Contact a health care provider if you have new or worsening symptoms.  Take over-the-counter and prescription medicines only as told by your health care provider. Do not take aspirin, ibuprofen, or other NSAIDs unless your health care provider told you to do so.  Keep all follow-up visits as told by your health care provider. This is important. This information is not intended to replace advice given to you by your health care provider. Make sure you discuss any questions you have with your health care provider. Document Revised: 08/15/2017 Document Reviewed: 08/15/2017 Elsevier Patient Education  2020 [employer].

## 2019-09-01 NOTE — Progress Notes (Signed)
Primary Care Physician: Assunta Found, MD  Primary Gastroenterologist:  Roetta Sessions, MD   Chief Complaint  Patient presents with  . Irritable Bowel Syndrome    HPI: Jane Rivera is a 24 y.o. female here for follow-up.  History of IBS-C, rectal bleeding, hemorrhoids, GERD.  Last seen in October 2020.  Colonoscopy in February 2020 was unremarkable.  Next colonoscopy planned at age 48.  Overall doing fairly well.  No longer needs Linzess.  Takes MiraLAX on occasion but none lately.  Generally has a bowel movement about every other day, [place] 5-6.  Some abdominal cramping beforehand may have a couple stools back to back.  Abdominal cramping better after bowel movement.  Does not feel like she needs anything for the symptoms.  Hemorrhoids not bothering her so much right now.  Uses hemorrhoid pads when needed.  Denies any typical heartburn on omeprazole.  She is having nausea about 2 times per week.  Usually happens when she first wakes up within a couple of hours.  She generally eats about 30 minutes after waking up.  Works third shift.  Takes all of her medications in the morning time however.  When the nausea happens, she just "rides it out".  Last usually for a couple of hours.  She been on omeprazole now since around August 2020.     Current Outpatient Medications  Medication Sig Dispense Refill  . buPROPion (WELLBUTRIN XL) 300 MG 24 hr tablet Take 1 tablet by mouth daily.    . cetirizine (ZYRTEC) 10 MG tablet Take 10 mg by mouth daily.    Marland Kitchen escitalopram (LEXAPRO) 20 MG tablet Take 1 tablet by mouth daily.  3  . hydrocortisone (ANUSOL-HC) 2.5 % rectal cream Place 1 application rectally 3 (three) times daily. Apply a pea size amount rectally 3 times daily (Patient taking differently: Place 1 application rectally as needed. Apply a pea size amount rectally 3 times daily) 30 g 0  . minocycline (MINOCIN) 100 MG capsule Take 100 mg by mouth daily.    Marland Kitchen omeprazole (PRILOSEC) 20 MG  capsule TAKE 1 CAPSULE(20 MG) BY MOUTH DAILY BEFORE BREAKFAST 90 capsule 1  . polyethylene glycol (MIRALAX / GLYCOLAX) 17 g packet Take 17 g by mouth daily as needed.    . TRI-SPRINTEC 0.18/0.215/0.25 MG-35 MCG tablet Take 1 tablet by mouth daily.  2   No current facility-administered medications for this visit.    Allergies as of 09/01/2019 - Review Complete 09/01/2019  Allergen Reaction Noted  . Amoxicillin  07/24/2017    ROS:  General: Negative for anorexia, weight loss, fever, chills, fatigue, weakness. ENT: Negative for hoarseness, difficulty swallowing , nasal congestion. CV: Negative for chest pain, angina, palpitations, dyspnea on exertion, peripheral edema.  Respiratory: Negative for dyspnea at rest, dyspnea on exertion, cough, sputum, wheezing.  GI: See history of present illness. GU:  Negative for dysuria, hematuria, urinary incontinence, urinary frequency, nocturnal urination.  Endo: Negative for unusual weight change.    Physical Examination:   BP (!) 150/98   Pulse 85   Temp 98.4 F (36.9 C) (Oral)   Ht 5\' 4"  (1.626 m)   Wt 220 lb 12.8 oz (100.2 kg)   LMP 08/18/2019 (Approximate)   BMI 37.90 kg/m   General: Well-nourished, well-developed in no acute distress.  Eyes: No icterus. Mouth: masked  Abdomen: Bowel sounds are normal, nontender, nondistended, no hepatosplenomegaly or masses, no abdominal bruits or hernia , no rebound or guarding.   Extremities:  No lower extremity edema. No clubbing or deformities. Neuro: Alert and oriented x 4   Skin: Warm and dry, no jaundice.   Psych: Alert and cooperative, normal mood and affect.   Imaging Studies: No results found.    Impression/plan:  Pleasant 24 year old female with IBS, GERD, hemorrhoids.  At this time her bowel movements have shifted to more of loose stool, occurring every other day.  Overall no longer requiring Linzess, using MiraLAX only when needed.  She does not feel she needs an antispasmodic at  this point.  Hemorrhoids not much of an issue lately.  We will continue to monitor and use hemorrhoid pads and or creams as needed.  Hemorrhoid banding an option if symptoms become more persistent.  We discussed her nausea and reflux symptoms.  Currently typical heartburn is well controlled.  Having nausea couple times a week, may be reflux variant.  Does not sound biliary.  Could be part of her irritable bowel.  Would like to see if she could come off her omeprazole in the near future, to see if is still necessary.  We discussed long-term use of this medication, potential side effects.  She will wean off for the next few weeks.  If she has only occasional heartburn symptoms she can always utilize Pepcid 20 mg twice daily as needed or Tums per package label.  If she notes that her reflux is regular, nausea worsens, she will likely require further PPI therapy.  Antireflux measures provided.  Further recommendations to follow after progress report.  Return to the office in 1 year or sooner if needed.

## 2019-10-06 IMAGING — CT CT ABD-PELV W/ CM
2 of 4 series · 16 of 46 positions shown, 18 images · IV contrast (Isovue)
Comparison: None.

CLINICAL DATA: Right lower abdominal pain with nausea of sudden
onset.

EXAM:
CT ABDOMEN AND PELVIS WITH CONTRAST
TECHNIQUE: Multidetector CT imaging of the abdomen and pelvis was performed
using the standard protocol following bolus administration of
intravenous contrast.
CONTRAST:  100mL 1NI0YM-3GG IOPAMIDOL (1NI0YM-3GG) INJECTION 61%

[Series 2: axial st · axial · 0.98mm/px · z∈[+977,+1417]mm · 13 of 98 slices shown, 15 images]
[im 5/98  soft-tissue]
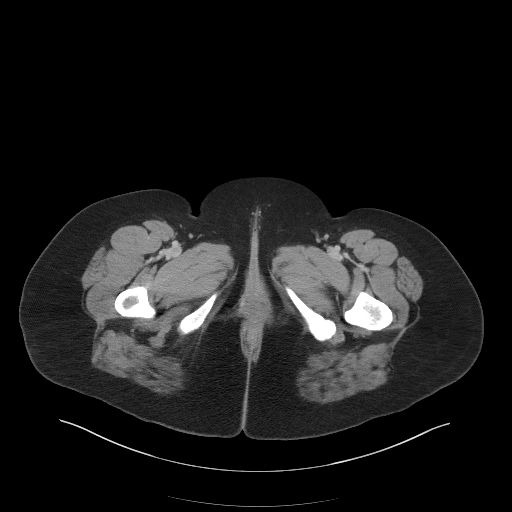
[im 5/98  bone]
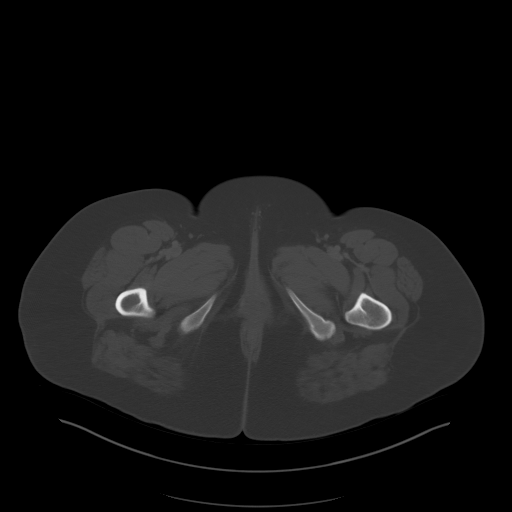
[im 13/98  soft-tissue]
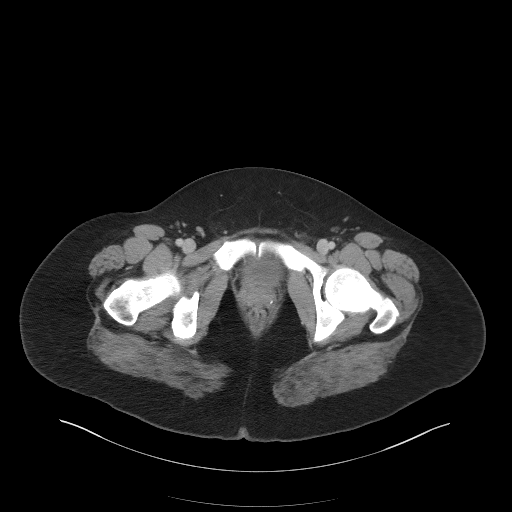
[im 21/98  soft-tissue]
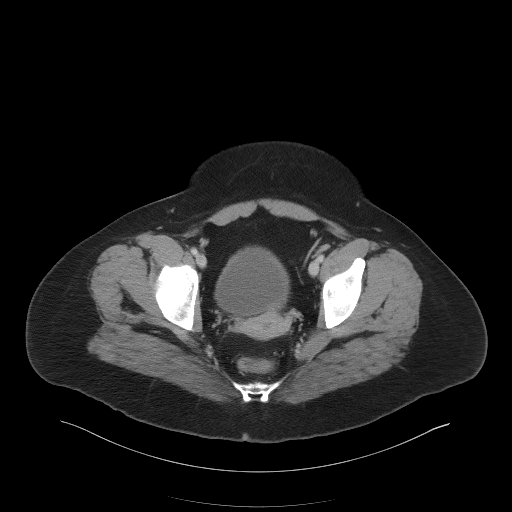
[im 29/98  soft-tissue]
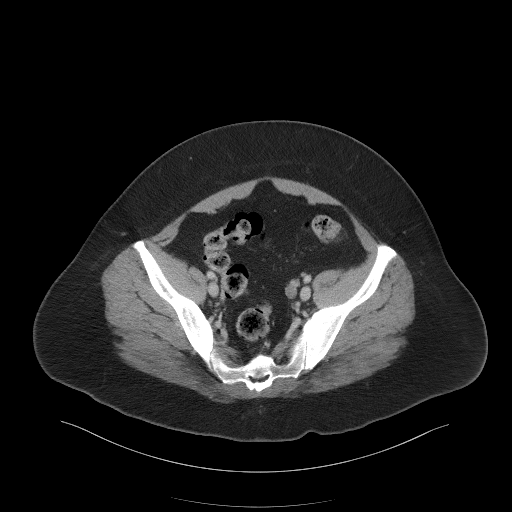
[im 33/98  soft-tissue]
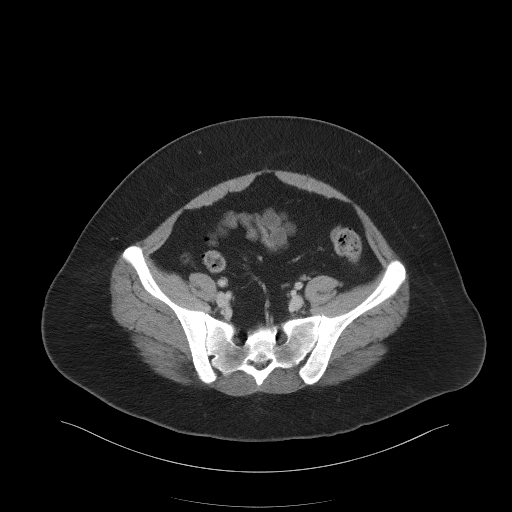
[im 41/98  soft-tissue]
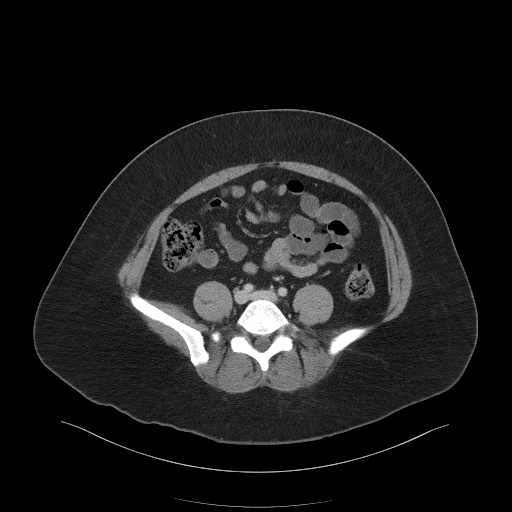
[im 49/98  soft-tissue]
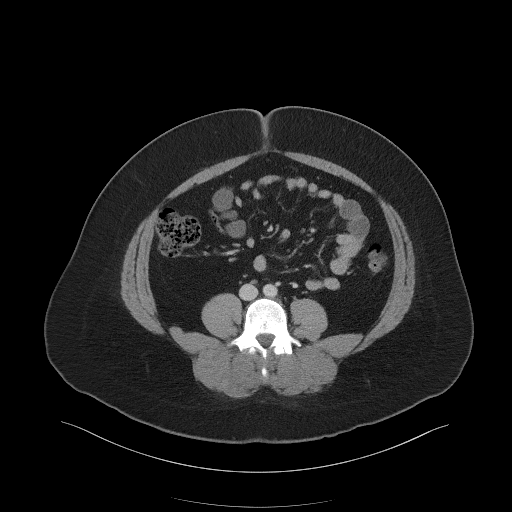
[im 57/98  soft-tissue]
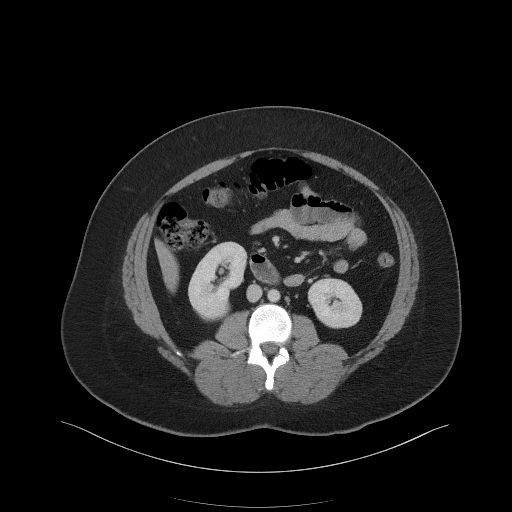
[im 65/98  soft-tissue]
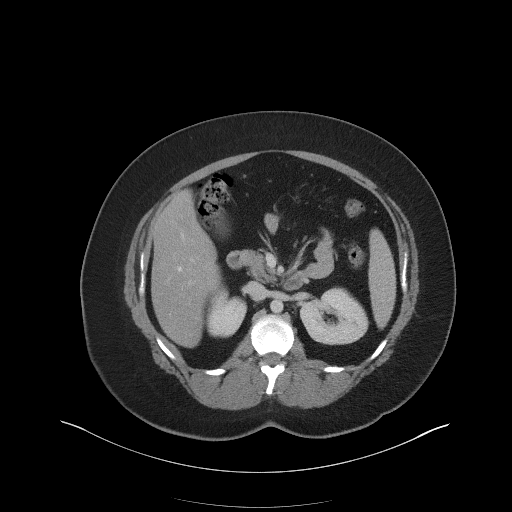
[im 65/98  bone]
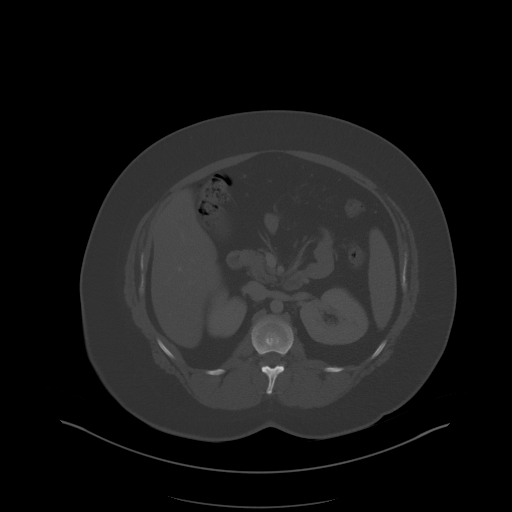
[im 69/98  soft-tissue]
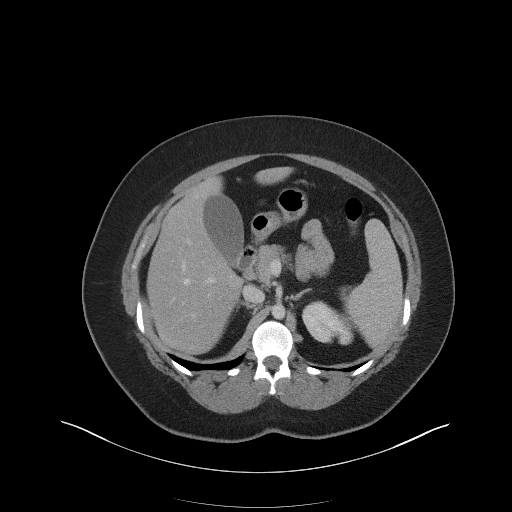
[im 77/98  soft-tissue]
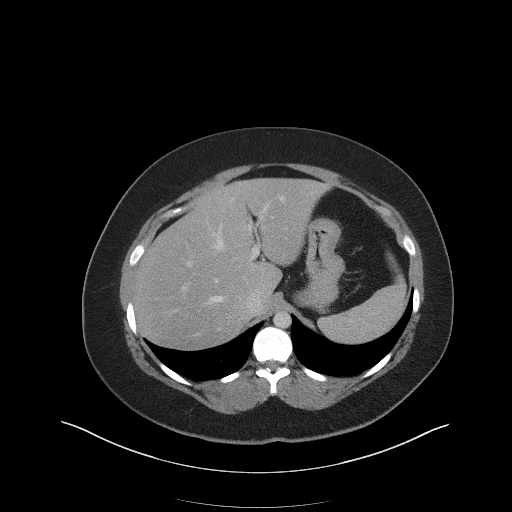
[im 85/98  soft-tissue]
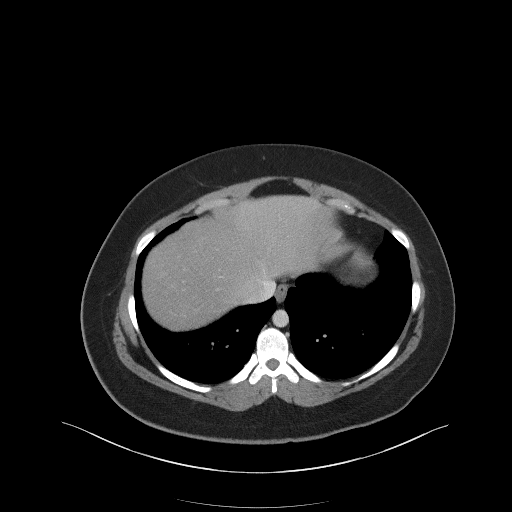
[im 93/98  soft-tissue]
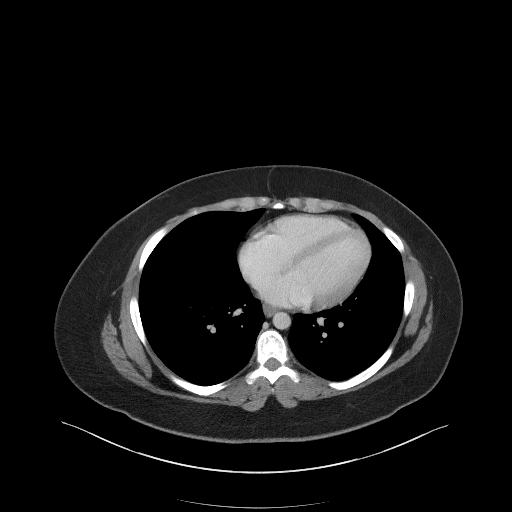

[Series 5: coronal st · coronal · 0.85mm/px · 3 of 117 slices shown]
[im 39/117  soft-tissue]
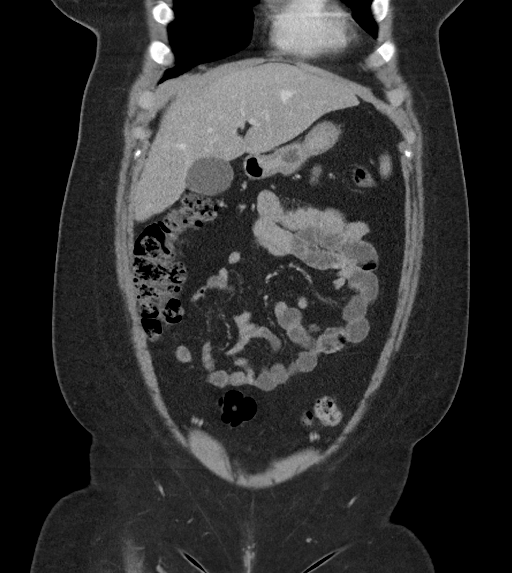
[im 52/117  soft-tissue]
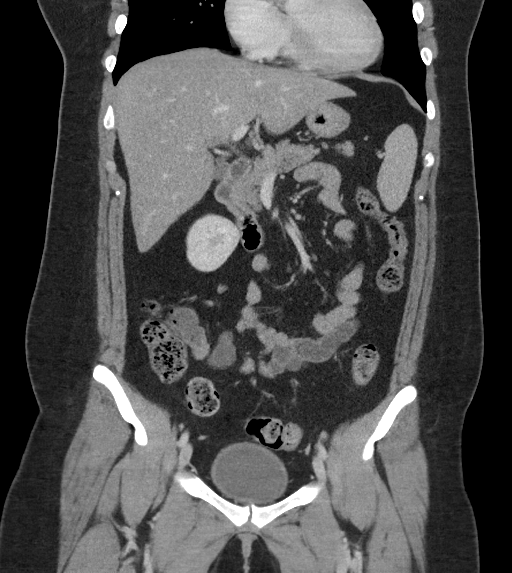
[im 65/117  soft-tissue]
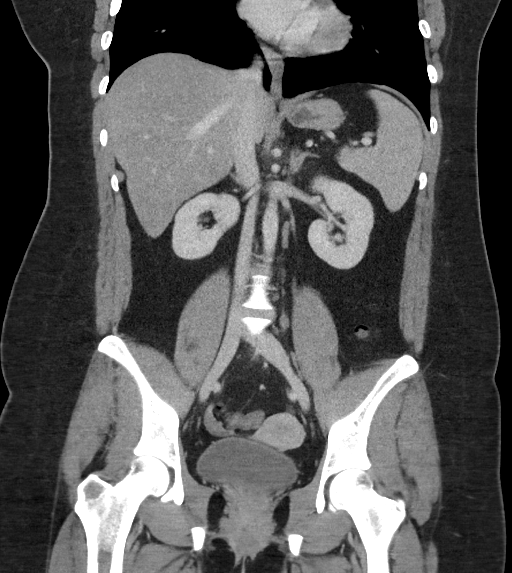

[16 of 46 positions shown; findings below may reference images not displayed]

FINDINGS: Lower chest: Normal size heart.  Clear lung bases.

Hepatobiliary: No focal liver abnormality is seen. No gallstones,
gallbladder wall thickening, or biliary dilatation.

Pancreas: Unremarkable. No pancreatic ductal dilatation or
surrounding inflammatory changes.

Spleen: Normal in size without focal abnormality.

Adrenals/Urinary Tract: Adrenal glands are unremarkable. Kidneys are
normal, without renal calculi, focal lesion, or hydronephrosis.
Bladder is unremarkable.

Stomach/Bowel: Mild fluid-filled distention of small bowel loops in
the left hemiabdomen possibly representing mild small bowel ileus or
dysmotility. Enteritis is also possibility. Normal appendix.
Moderate stool burden within the colon.

Vascular/Lymphatic: No significant vascular findings are present. No
enlarged abdominal or pelvic lymph nodes.

Reproductive: Uterus and bilateral adnexa are unremarkable.

Other: No abdominal wall hernia or abnormality. No abdominopelvic
ascites.

Musculoskeletal: No acute or significant osseous findings.
IMPRESSION: 1. Mild fluid-filled distention of small bowel in the left
hemiabdomen raises the possibly of mild localized ileus or
enteritis. Small bowel dysmotility is also possibility. No
mechanical bowel obstruction.
2. Normal appendix.
3. Moderate fecal retention within the colon.

## 2019-10-15 ENCOUNTER — Telehealth: Payer: Self-pay

## 2019-10-15 MED ORDER — OMEPRAZOLE 20 MG PO CPDR: DELAYED_RELEASE_CAPSULE | ORAL | 3 refills | Status: DC

## 2019-10-15 NOTE — Addendum Note (Signed)
Addended by: Tiffany Kocher on: 10/15/2019 07:42 PM   Modules accepted: Orders

## 2019-10-15 NOTE — Telephone Encounter (Signed)
Ok to resume omeprazole. rx sent to pharmacy.

## 2019-10-15 NOTE — Telephone Encounter (Signed)
Pt called office, she weaned herself off Omeprazole as suggested at last OV and tried Tums and Pepcid as needed. She has been having chest discomfort and heartburn. She wants to know if she can restart Omeprazole 20mg  daily like she was taking before? Felt better when she took the Omeprazole. If she may restart Omeprazole she requests for rx to be sent to [facility] on [address]

## 2019-10-16 NOTE — Telephone Encounter (Signed)
Tried to call pt, no answer, LMOVM to inform pt. 

## 2020-02-27 ENCOUNTER — Other Ambulatory Visit: Payer: Self-pay

## 2020-02-27 DIAGNOSIS — Z20822 Contact with and (suspected) exposure to covid-19: Secondary | ICD-10-CM

## 2020-03-02 LAB — NOVEL CORONAVIRUS, NAA: SARS-CoV-2, NAA: DETECTED — AB

## 2020-03-02 LAB — SPECIMEN STATUS REPORT

## 2020-07-30 ENCOUNTER — Other Ambulatory Visit: Payer: Self-pay | Admitting: Gastroenterology

## 2020-08-18 ENCOUNTER — Encounter: Payer: Self-pay | Admitting: Internal Medicine

## 2020-08-19 ENCOUNTER — Encounter: Payer: Self-pay | Admitting: Internal Medicine

## 2020-11-02 DIAGNOSIS — Z6838 Body mass index (BMI) 38.0-38.9, adult: Secondary | ICD-10-CM | POA: Diagnosis not present

## 2020-11-02 DIAGNOSIS — L309 Dermatitis, unspecified: Secondary | ICD-10-CM | POA: Diagnosis not present

## 2020-11-02 DIAGNOSIS — N342 Other urethritis: Secondary | ICD-10-CM | POA: Diagnosis not present

## 2020-12-07 ENCOUNTER — Other Ambulatory Visit: Payer: Self-pay | Admitting: Gastroenterology

## 2021-11-24 ENCOUNTER — Other Ambulatory Visit: Payer: Self-pay

## 2021-11-30 ENCOUNTER — Telehealth: Payer: Self-pay | Admitting: Oncology

## 2021-11-30 NOTE — Telephone Encounter (Signed)
Scheduled appt per 10/11 referral. Pt is aware of appt date and time. Pt is aware to arrive 15 mins prior to appt time and to bring and updated insurance card. Pt is aware of appt location.   

## 2021-12-23 ENCOUNTER — Other Ambulatory Visit: Payer: Self-pay

## 2021-12-23 ENCOUNTER — Inpatient Hospital Stay: Payer: BC Managed Care – PPO

## 2021-12-23 ENCOUNTER — Inpatient Hospital Stay: Payer: BC Managed Care – PPO | Attending: Oncology | Admitting: Oncology

## 2021-12-23 VITALS — BP 120/79 | HR 98 | Temp 98.5°F | Resp 16 | Wt 226.5 lb

## 2021-12-23 DIAGNOSIS — K581 Irritable bowel syndrome with constipation: Secondary | ICD-10-CM | POA: Diagnosis not present

## 2021-12-23 DIAGNOSIS — D500 Iron deficiency anemia secondary to blood loss (chronic): Secondary | ICD-10-CM

## 2021-12-23 DIAGNOSIS — D649 Anemia, unspecified: Secondary | ICD-10-CM | POA: Insufficient documentation

## 2021-12-23 DIAGNOSIS — N920 Excessive and frequent menstruation with regular cycle: Secondary | ICD-10-CM | POA: Insufficient documentation

## 2021-12-23 DIAGNOSIS — N938 Other specified abnormal uterine and vaginal bleeding: Secondary | ICD-10-CM | POA: Insufficient documentation

## 2021-12-23 DIAGNOSIS — D509 Iron deficiency anemia, unspecified: Secondary | ICD-10-CM | POA: Diagnosis not present

## 2021-12-23 DIAGNOSIS — G4730 Sleep apnea, unspecified: Secondary | ICD-10-CM | POA: Diagnosis not present

## 2021-12-23 DIAGNOSIS — D72829 Elevated white blood cell count, unspecified: Secondary | ICD-10-CM | POA: Diagnosis not present

## 2021-12-23 DIAGNOSIS — E669 Obesity, unspecified: Secondary | ICD-10-CM | POA: Diagnosis not present

## 2021-12-23 DIAGNOSIS — D72828 Other elevated white blood cell count: Secondary | ICD-10-CM

## 2021-12-23 LAB — CBC WITH DIFFERENTIAL (CANCER CENTER ONLY)
Abs Immature Granulocytes: 0.03 10*3/uL (ref 0.00–0.07)
Basophils Absolute: 0 10*3/uL (ref 0.0–0.1)
Basophils Relative: 0 %
Eosinophils Absolute: 0.3 10*3/uL (ref 0.0–0.5)
Eosinophils Relative: 4 %
HCT: 38.2 % (ref 36.0–46.0)
Hemoglobin: 13.3 g/dL (ref 12.0–15.0)
Immature Granulocytes: 0 %
Lymphocytes Relative: 30 %
Lymphs Abs: 2.4 10*3/uL (ref 0.7–4.0)
MCH: 28.9 pg (ref 26.0–34.0)
MCHC: 34.8 g/dL (ref 30.0–36.0)
MCV: 82.9 fL (ref 80.0–100.0)
Monocytes Absolute: 0.4 10*3/uL (ref 0.1–1.0)
Monocytes Relative: 4 %
Neutro Abs: 5 10*3/uL (ref 1.7–7.7)
Neutrophils Relative %: 62 %
Platelet Count: 371 10*3/uL (ref 150–400)
RBC: 4.61 MIL/uL (ref 3.87–5.11)
RDW: 12.7 % (ref 11.5–15.5)
WBC Count: 8.2 10*3/uL (ref 4.0–10.5)
nRBC: 0 % (ref 0.0–0.2)

## 2021-12-23 LAB — FERRITIN: Ferritin: 40 ng/mL (ref 11–307)

## 2021-12-23 LAB — VITAMIN B12: Vitamin B-12: 148 pg/mL — ABNORMAL LOW (ref 180–914)

## 2021-12-23 LAB — FOLATE: Folate: 10.9 ng/mL (ref >5.9)

## 2021-12-23 LAB — PROTIME-INR
INR: 1 (ref 0.8–1.2)
Prothrombin Time: 13.4 seconds (ref 11.4–15.2)

## 2021-12-23 LAB — APTT: aPTT: 30 seconds (ref 24–36)

## 2021-12-23 NOTE — Patient Instructions (Signed)
You can decrease your iron intake to one tablet of iron sulfate 325 mg daily.  Another option is to take low dose iron in the form of Flintstones vitamin with iron or [facility] brand iron 25 mg daily.   With regard to vitamin B12 you can start vitamin B12 1 mg by mouth or under the tongue taken daily

## 2021-12-23 NOTE — Progress Notes (Signed)
[facility] Initial Visit:  Patient Care Team: Sharilyn Sites, MD as PCP - General (Family Medicine) Gala Romney Cristopher Estimable, MD as Consulting Physician (Gastroenterology)  CHIEF COMPLAINTS/PURPOSE OF CONSULTATION:  Oncology History   No history exists.    HISTORY OF PRESENTING ILLNESS: Jane Rivera 26 y.o. female is here because of anemia and leukocytosis Medical history notable for skin rash, obesity and sleep apnea.  Doesn't use CPAP regularly because she finds it to be uncomfortable.   August 05 2021:  WBC 13.2 Hgb 10.9 MCV 77 PLT 447; 54 seg 37 lymph 6 mono 2 eos 1 baso Blood work was taken for a wellness visit September 21, 2021 B12 is 225 Ferritin 9   November 05, 2021 WBC 9.2 hemoglobin 13.4 MCV 83 platelet count 375; 63 segs 28 lymphs 5 monos 4 eos B12 394 folate 13.2 ferritin 49 reticulocyte count 2.8%  December 23 2021:  [facility] Consult  Following the June 2023 lab results patient was placed on vitamin B12 injections and placed on Iron sulfate bid. She states that she has not been taking oral iron for about a week because she ran out of it.  Patient has IBSC but was able to tolerate oral iron.  Patient is G0 P0.  Menses occur monthly and are regular; last 5 days and are moderate.  No bleeding between periods. Not on blood thinners.  Takes an occasional ibuprofen.  No epistaxis or gingival bleeding.  No excessive bleeding after wisdom teeth removal.    Had ice pica before starting oral iron but has improved.  Has IBSC and underwent colonoscopy in the past.  Last gynecology exam was about a year ago and seen by a gynecologist  Social:  Single.  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  No tobacco.  EtOH one drink every month.    [facility] Mother alive 35 HTN Father alive 88 brain tumor, benign and has seizures following treatment, HTN Brother alive 36 HTN   Review of Systems  Constitutional:  Negative for appetite change, chills, fatigue, fever and  unexpected weight change.  HENT:   Negative for mouth sores, nosebleeds, trouble swallowing and voice change.        Mild sore throat from allergies  Eyes:  Negative for eye problems and icterus.       Vision changes:  None  Respiratory:  Positive for shortness of breath. Negative for cough and hemoptysis.        DOE is runs heavily  Cardiovascular:  Positive for palpitations. Negative for leg swelling.       Gets chest discomfort when anxious  Gastrointestinal:  Positive for constipation. Negative for abdominal pain, blood in stool, diarrhea, nausea and vomiting.       Occasional diarrhea  Endocrine: Negative for hot flashes.       Cold intolerance:  none Heat intolerance:  none  Genitourinary:  Negative for difficulty urinating, dysuria, frequency and hematuria.   Musculoskeletal:  Negative for arthralgias, back pain, gait problem and myalgias.  Skin:  Negative for itching, rash and wound.  Neurological:  Positive for numbness. Negative for extremity weakness, gait problem, headaches and light-headedness.       Numbness and tingling when has anxiety attacks  Hematological:  Negative for adenopathy. Does not bruise/bleed easily.  Psychiatric/Behavioral:  Negative for sleep disturbance and suicidal ideas. The patient is nervous/anxious.     MEDICAL HISTORY: Past Medical History:  Diagnosis Date   Anxiety    Family history of  adverse reaction to anesthesia    Severe PONV   PONV (postoperative nausea and vomiting)    Rash    controlled with zyrtec   Sleep apnea     SURGICAL HISTORY: Past Surgical History:  Procedure Laterality Date   COLONOSCOPY WITH PROPOFOL N/A 03/28/2018   Dr. Jena Gauss: Internal hemorrhoids.  Next colonoscopy age 6   WISDOM TOOTH EXTRACTION      SOCIAL HISTORY: Social History   Socioeconomic History   Marital status: Single    Spouse name: Not on file   Number of children: Not on file   Years of education: Not on file   Highest education level: Not on  file  Occupational History   Not on file  Tobacco Use   Smoking status: Never   Smokeless tobacco: Never  Vaping Use   Vaping Use: Never used  Substance and Sexual Activity   Alcohol use: No   Drug use: No   Sexual activity: Not Currently    Birth control/protection: Pill  Other Topics Concern   Not on file  Social History Narrative   Graduated from [employer] college.   Social Determinants of Health   Financial Resource Strain: Not on file  Food Insecurity: Not on file  Transportation Needs: Not on file  Physical Activity: Not on file  Stress: Not on file  Social Connections: Not on file  Intimate Partner Violence: Not on file    FAMILY HISTORY Family History  Problem Relation Age of Onset   Hypertension Mother    Hypertension Father    Inflammatory bowel disease Neg Hx    Celiac disease Neg Hx    Colon cancer Neg Hx     ALLERGIES:  is allergic to amoxicillin.  MEDICATIONS:  Current Outpatient Medications  Medication Sig Dispense Refill   cetirizine (ZYRTEC) 10 MG tablet Take 10 mg by mouth daily.     diazepam (VALIUM) 2 MG tablet Take 2 mg by mouth 3 (three) times daily.     DULoxetine (CYMBALTA) 30 MG capsule SMARTSIG:1 Capsule(s) By Mouth Every Evening     DULoxetine (CYMBALTA) 60 MG capsule Take 60 mg by mouth daily.     hydrocortisone (ANUSOL-HC) 2.5 % rectal cream Place 1 application rectally 3 (three) times daily. Apply a pea size amount rectally 3 times daily (Patient taking differently: Place 1 application  rectally as needed. Apply a pea size amount rectally 3 times daily) 30 g 0   metoprolol succinate (TOPROL-XL) 50 MG 24 hr tablet Take 50 mg by mouth daily.     omeprazole (PRILOSEC) 20 MG capsule TAKE 1 CAPSULE BY MOUTH IN THE MORNING BEFORE BREAKFAST 90 capsule 3   polyethylene glycol (MIRALAX / GLYCOLAX) 17 g packet Take 17 g by mouth daily as needed.     TRI-SPRINTEC 0.18/0.215/0.25 MG-35 MCG tablet Take 1 tablet by mouth daily.  2   No current  facility-administered medications for this visit.    PHYSICAL EXAMINATION:  ECOG PERFORMANCE STATUS: 0 - Asymptomatic   Vitals:   12/23/21 1518  BP: 120/79  Pulse: 98  Resp: 16  Temp: 98.5 F (36.9 C)  SpO2: 100%    Filed Weights   12/23/21 1518  Weight: 226 lb 8 oz (102.7 kg)     Physical Exam Vitals and nursing note reviewed.  Constitutional:      General: She is not in acute distress.    Appearance: Normal appearance. She is obese. She is not ill-appearing, toxic-appearing or diaphoretic.  Comments: Here with boyfriend  HENT:     Head: Normocephalic and atraumatic.     Right Ear: External ear normal.     Left Ear: External ear normal.     Nose: Nose normal. No congestion or rhinorrhea.  Eyes:     General: No scleral icterus.    Extraocular Movements: Extraocular movements intact.     Conjunctiva/sclera: Conjunctivae normal.     Pupils: Pupils are equal, round, and reactive to light.  Cardiovascular:     Rate and Rhythm: Normal rate.     Heart sounds: No murmur heard.    No friction rub. No gallop.  Pulmonary:     Effort: Pulmonary effort is normal. No respiratory distress.     Breath sounds: Normal breath sounds. No stridor. No wheezing or rhonchi.  Abdominal:     General: Bowel sounds are normal. There is no distension.     Palpations: Abdomen is soft.     Tenderness: There is no abdominal tenderness. There is no rebound.  Musculoskeletal:        General: No swelling, tenderness, deformity or signs of injury.     Cervical back: Normal range of motion and neck supple. No rigidity or tenderness.  Lymphadenopathy:     Head:     Right side of head: No submental, submandibular, tonsillar, preauricular, posterior auricular or occipital adenopathy.     Left side of head: No submental, submandibular, tonsillar, preauricular, posterior auricular or occipital adenopathy.     Cervical: No cervical adenopathy.     Right cervical: No superficial, deep or posterior  cervical adenopathy.    Left cervical: No superficial, deep or posterior cervical adenopathy.     Upper Body:     Right upper body: No supraclavicular, axillary, pectoral or epitrochlear adenopathy.     Left upper body: No supraclavicular, axillary, pectoral or epitrochlear adenopathy.  Skin:    General: Skin is warm.     Coloration: Skin is not jaundiced or pale.     Findings: No bruising or erythema.  Neurological:     General: No focal deficit present.     Mental Status: She is alert and oriented to person, place, and time.     Cranial Nerves: No cranial nerve deficit.  Psychiatric:        Mood and Affect: Mood normal.        Behavior: Behavior normal.        Thought Content: Thought content normal.        Judgment: Judgment normal.      LABORATORY DATA: I have personally reviewed the data as listed:  Clinical Support on 12/23/2021  Component Date Value Ref Range Status   Prothrombin Time 12/23/2021 13.4  11.4 - 15.2 seconds Final   INR 12/23/2021 1.0  0.8 - 1.2 Final   Comment: (NOTE) INR goal varies based on device and disease states. Performed at [facility], [address], [place], [place] [address]    aPTT 12/23/2021 30  24 - 36 seconds Final   Performed at [facility], [address], [place], [place] [address]   Coagulation Factor VIII 12/23/2021 177 (H)  56 - 140 % Final   Comment: (NOTE) FVIII activity can increase in a variety of clinical situations including normal pregnancy, in samples drawn from patients (particularly children) who are visibly stressed at the time of phlebotomy, as acute phase reactants, or in response to certain drug therapies such as DDAVP.  Persistently elevated FVIII  activity is a risk factor for venous thrombosis as well as recurrence of venous thrombosis.  Risk is graded and increases with the degree of elevation.  Although elevated FVIII activity has been identified to cluster within  families, a genetic basis for the elevation has not yet been elucidated (Br J Haematol. 2012; 161:096-045).    Ristocetin Co-factor, Plasma 12/23/2021 74  50 - 200 % Final   Comment: (NOTE) Performed At: [facility] [address] [place], [place] [id number] Jolene Schimke MD NW:[id number]    Von Willebrand Antigen, Plasma 12/23/2021 145  50 - 200 % Final   Comment: (NOTE) This test was developed and its performance characteristics determined by [facility]. It has not been cleared or approved by the Food and Drug Administration.    Vitamin B-12 12/23/2021 148 (L)  180 - 914 pg/mL Final   Comment: (NOTE) This assay is not validated for testing neonatal or myeloproliferative syndrome specimens for Vitamin B12 levels. Performed at [facility], [address] [place], [place] [address]    Folate 12/23/2021 10.9  >5.9 ng/mL Final   Performed at [facility], [address], [place], [place] [address]   Ferritin 12/23/2021 40  11 - 307 ng/mL Final   Performed at [facility] [address] [place], [place] [address]   WBC Count 12/23/2021 8.2  4.0 - 10.5 K/uL Final   RBC 12/23/2021 4.61  3.87 - 5.11 MIL/uL Final   Hemoglobin 12/23/2021 13.3  12.0 - 15.0 g/dL Final   HCT 24/40/1027 38.2  36.0 - 46.0 % Final   MCV 12/23/2021 82.9  80.0 - 100.0 fL Final   MCH 12/23/2021 28.9  26.0 - 34.0 pg Final   MCHC 12/23/2021 34.8  30.0 - 36.0 g/dL Final   RDW 25/36/6440 12.7  11.5 - 15.5 % Final   Platelet Count 12/23/2021 371  150 - 400 K/uL Final   nRBC 12/23/2021 0.0  0.0 - 0.2 % Final   Neutrophils Relative % 12/23/2021 62  % Final   Neutro Abs 12/23/2021 5.0  1.7 - 7.7 K/uL Final   Lymphocytes Relative 12/23/2021 30  % Final   Lymphs Abs 12/23/2021 2.4  0.7 - 4.0 K/uL Final   Monocytes Relative 12/23/2021 4  % Final   Monocytes Absolute 12/23/2021 0.4  0.1 - 1.0 K/uL Final   Eosinophils Relative 12/23/2021 4  % Final    Eosinophils Absolute 12/23/2021 0.3  0.0 - 0.5 K/uL Final   Basophils Relative 12/23/2021 0  % Final   Basophils Absolute 12/23/2021 0.0  0.0 - 0.1 K/uL Final   Immature Granulocytes 12/23/2021 0  % Final   Abs Immature Granulocytes 12/23/2021 0.03  0.00 - 0.07 K/uL Final   Performed at [facility], [address] [place], [place] [address]   Interpretation 12/23/2021 Note   Final   Comment: (NOTE) ------------------------------- COAGULATION: VON WILLEBRAND FACTOR ASSESSMENT CURRENT RESULTS ASSESSMENT The VWF:Ag is normal. The VWF:RCo is normal. The FVIII is elevated. VON WILLEBRAND FACTOR ASSESSMENT CURRENT RESULTS INTERPRETATION - These results are not consistent with a diagnosis of VWD according to the current NHLBI guideline. Persistently elevated FVIII activity is a risk factor for venous thrombosis as well as recurrence of venous thrombosis. Risk is graded and increases with the degree of elevation. Although elevated FVIII activity has been identified to cluster within families, a genetic basis for the elevation has not yet been elucidated (Br J Haematol. 2012; 157(6):653-663). VON [place]  FACTOR ASSESSMENT - Results may be falsely elevated and possibly falsely normal as VWF and FVIII may increase in pregnancy, in samples drawn from patients (particularly children) who are visibly stressed at the time of phlebotomy, as acute phase reactants                          , or in response to certain drug therapies such as desmopressin. Repeat testing may be necessary before excluding a diagnosis of VWD especially if the clinical suspicion is high for an underlying bleeding disorder. The setting for phlebotomy should be as calm as possible and patients should be encouraged to sit quietly prior to the blood draw. VON WILLEBRAND FACTOR ASSESSMENT DEFINITIONS - VWD - von Willebrand disease; VWF - von Willebrand factor; VWF:Ag - VWF antigen; VWF:RCo -  VWF ristocetin cofactor activity; FVIII - factor VIII activity. MEDICAL DIRECTOR: For questions regarding panel interpretation, please contact Lebron ConnersBrian Poirier, M.D. at [facility] at [phone]. ------------------------------- DISCLAIMER These assessments and interpretations are provided as a convenience in support of the physician-patient relationship and are not intended to replace the physician's clinical judgment. They are derived from national guidelines in addition                           to other evidence and expert opinion. The clinician should consider this information within the context of clinical opinion and the individual patient. SEE GUIDANCE FOR VON WILLEBRAND FACTOR ASSESSMENT: (1) The National Heart, Lung and Blood Institute. The Diagnosis, Evaluation and Management of von Willebrand Disease. Lafe GarinBethesda, MD: [employer] of Health Publication [id number]. 2007. Available at http://kemp.com/http://www.nhlbi.nih.gov/guidelines/vwd/. (2) Annie SableNichols WL et al. Othella BoyerAm J Hematol. 2009; 84(6):366-370. (3) Laffan M et al. Haemophilia. 2004;10(3):199-217. (4) Pasi KJ et al. Haemophilia. 2004; 10(3):218-231. Performed At: [facility] [address] [place], [place] [id number] Blanchie ServeEnnis Jennifer MD ON:[phone]     RADIOGRAPHIC STUDIES: I have personally reviewed the radiological images as listed and agree with the findings in the report  No results found.  ASSESSMENT/PLAN Patient is a 26 year old female with microcytic anemia presumed to be secondary to dysfunctional uterine bleeding   Anemia:  Most likely etiology is iron deficiency anemia owing to dysfunctional uterine bleeding/ exacerbated by high demand owing to prior pregnancies.  Will obtain CBC with diff, Ferritin, B12, folate, retic count,  DAT, Haptoglobin  Dysfunctional uterine bleeding:  This is characterized by menses that are prolonged, irregular as well as by heavy bleeding with passage of  clots.  Will evaluate for possible bleeding disorder with PT, PTT, Fibrinogen, von Willebrand screen.  Refer to gynecology for evaluation and management  Therapeutics:  Continue oral iron since she is tolerating it well and Hgb has responded.  Iron stores will take longer to respond  Granulocytosis:  Has been noted on only 1 CBC with diff.  Will recheck labs today and if granulocytosis then proceed with evaluation at next visit.      Cancer Staging  No matching staging information was found for the patient.   No problem-specific Assessment & Plan notes found for this encounter.   Orders Placed This Encounter  Procedures   CBC with Differential (Cancer Center Only)    Standing Status:   Future    Number of Occurrences:   1    Standing Expiration Date:   12/24/2022   Ferritin    Standing Status:   Future  Number of Occurrences:   1    Standing Expiration Date:   12/24/2022   Folate    Standing Status:   Future    Number of Occurrences:   1    Standing Expiration Date:   12/24/2022   Vitamin B12    Standing Status:   Future    Number of Occurrences:   1    Standing Expiration Date:   12/24/2022   Von Willebrand panel    Standing Status:   Future    Number of Occurrences:   1    Standing Expiration Date:   12/24/2022   APTT    Standing Status:   Future    Number of Occurrences:   1    Standing Expiration Date:   12/24/2022   Protime-INR    Standing Status:   Future    Number of Occurrences:   1    Standing Expiration Date:   12/24/2022   CBC with Differential (Cancer Center Only)    Standing Status:   Future    Standing Expiration Date:   12/24/2022   Vitamin B12    Standing Status:   Future    Standing Expiration Date:   12/24/2022   Ferritin    Standing Status:   Future    Standing Expiration Date:   12/24/2022    All questions were answered. The patient knows to call the clinic with any problems, questions or concerns.  This note was electronically signed.    Loni Muse, MD  01/02/2022 3:41 PM

## 2021-12-24 LAB — VON WILLEBRAND PANEL
Coagulation Factor VIII: 177 % — ABNORMAL HIGH (ref 56–140)
Ristocetin Co-factor, Plasma: 74 % (ref 50–200)
Von Willebrand Antigen, Plasma: 145 % (ref 50–200)

## 2021-12-24 LAB — COAG STUDIES INTERP REPORT

## 2021-12-26 ENCOUNTER — Encounter: Payer: Self-pay | Admitting: Oncology

## 2021-12-30 ENCOUNTER — Other Ambulatory Visit: Payer: Self-pay

## 2021-12-30 ENCOUNTER — Inpatient Hospital Stay: Payer: BC Managed Care – PPO

## 2021-12-30 DIAGNOSIS — D500 Iron deficiency anemia secondary to blood loss (chronic): Secondary | ICD-10-CM

## 2021-12-30 DIAGNOSIS — D509 Iron deficiency anemia, unspecified: Secondary | ICD-10-CM | POA: Diagnosis not present

## 2021-12-30 MED ORDER — CYANOCOBALAMIN 1000 MCG/ML IJ SOLN
1000.0000 ug | Freq: Once | INTRAMUSCULAR | Status: AC
  Administered 2021-12-30: 1000 ug via INTRAMUSCULAR
  Filled 2021-12-30: qty 1

## 2022-01-02 ENCOUNTER — Encounter: Payer: Self-pay | Admitting: Oncology

## 2022-01-02 DIAGNOSIS — D72828 Other elevated white blood cell count: Secondary | ICD-10-CM | POA: Insufficient documentation

## 2022-01-27 ENCOUNTER — Other Ambulatory Visit: Payer: Self-pay

## 2022-01-27 ENCOUNTER — Inpatient Hospital Stay: Payer: BC Managed Care – PPO | Attending: Oncology

## 2022-01-27 DIAGNOSIS — E538 Deficiency of other specified B group vitamins: Secondary | ICD-10-CM | POA: Insufficient documentation

## 2022-01-27 DIAGNOSIS — D500 Iron deficiency anemia secondary to blood loss (chronic): Secondary | ICD-10-CM

## 2022-01-27 MED ORDER — CYANOCOBALAMIN 1000 MCG/ML IJ SOLN
1000.0000 ug | Freq: Once | INTRAMUSCULAR | Status: AC
  Administered 2022-01-27: 1000 ug via INTRAMUSCULAR
  Filled 2022-01-27: qty 1

## 2022-02-24 ENCOUNTER — Ambulatory Visit: Payer: BC Managed Care – PPO

## 2022-03-03 ENCOUNTER — Inpatient Hospital Stay: Payer: BC Managed Care – PPO | Attending: Oncology

## 2022-03-03 DIAGNOSIS — E538 Deficiency of other specified B group vitamins: Secondary | ICD-10-CM | POA: Diagnosis present

## 2022-03-03 DIAGNOSIS — D500 Iron deficiency anemia secondary to blood loss (chronic): Secondary | ICD-10-CM

## 2022-03-03 MED ORDER — CYANOCOBALAMIN 1000 MCG/ML IJ SOLN
1000.0000 ug | Freq: Once | INTRAMUSCULAR | Status: AC
  Administered 2022-03-03: 1000 ug via INTRAMUSCULAR
  Filled 2022-03-03: qty 1

## 2022-03-03 NOTE — Patient Instructions (Signed)
Vitamin B12 Deficiency Vitamin B12 deficiency occurs when the body does not have enough of this important vitamin. The body needs this vitamin: To make red blood cells. To make DNA. This is the genetic material inside cells. To help the nerves work properly so they can carry messages from the brain to the body. Vitamin B12 deficiency can cause health problems, such as not having enough red blood cells in the blood (anemia). This can lead to nerve damage if untreated. What are the causes? This condition may be caused by: Not eating enough foods that contain vitamin B12. Not having enough stomach acid and digestive fluids to properly absorb vitamin B12 from the food that you eat. Having certain diseases that make it hard to absorb vitamin B12. These diseases include Crohn's disease, chronic pancreatitis, and cystic fibrosis. An autoimmune disorder in which the body does not make enough of a protein (intrinsic factor) within the stomach, resulting in not enough absorption of vitamin B12. Having a surgery in which part of the stomach or small intestine is removed. Taking certain medicines that make it hard for the body to absorb vitamin B12. These include: Heartburn medicines, such as antacids and proton pump inhibitors. Some medicines that are used to treat diabetes. What increases the risk? The following factors may make you more likely to develop a vitamin B12 deficiency: Being an older adult. Eating a vegetarian or vegan diet that does not include any foods that come from animals. Eating a poor diet while you are pregnant. Taking certain medicines. Having alcoholism. What are the signs or symptoms? In some cases, there are no symptoms of this condition. If the condition leads to anemia or nerve damage, various symptoms may occur, such as: Weakness. Tiredness (fatigue). Loss of appetite. Numbness or tingling in your hands and feet. Redness and burning of the tongue. Depression,  confusion, or memory problems. Trouble walking. If anemia is severe, symptoms can include: Shortness of breath. Dizziness. Rapid heart rate. How is this diagnosed? This condition may be diagnosed with a blood test to measure the level of vitamin B12 in your blood. You may also have other tests, including: A group of tests that measure certain characteristics of blood cells (complete blood count, CBC). A blood test to measure intrinsic factor. A procedure where a thin tube with a camera on the end is used to look into your stomach or intestines (endoscopy). Other tests may be needed to discover the cause of the deficiency. How is this treated? Treatment for this condition depends on the cause. This condition may be treated by: Changing your eating and drinking habits, such as: Eating more foods that contain vitamin B12. Drinking less alcohol or no alcohol. Getting vitamin B12 injections. Taking vitamin B12 supplements by mouth (orally). Your health care provider will tell you which dose is best for you. Follow these instructions at home: Eating and drinking  Include foods in your diet that come from animals and contain a lot of vitamin B12. These include: Meats and poultry. This includes beef, pork, chicken, turkey, and organ meats, such as liver. Seafood. This includes clams, rainbow trout, salmon, tuna, and haddock. Eggs. Dairy foods such as milk, yogurt, and cheese. Eat foods that have vitamin B12 added to them (are fortified), such as ready-to-eat breakfast cereals. Check the label on the package to see if a food is fortified. The items listed above may not be a complete list of foods and beverages you can eat and drink. Contact a dietitian for   more information. Alcohol use Do not drink alcohol if: Your health care provider tells you not to drink. You are pregnant, may be pregnant, or are planning to become pregnant. If you drink alcohol: Limit how much you have to: 0-1 drink a  day for women. 0-2 drinks a day for men. Know how much alcohol is in your drink. In the [place] one drink equals one 12 oz bottle of beer (355 mL), one 5 oz glass of wine (148 mL), or one 1 oz glass of hard liquor (44 mL). General instructions Get vitamin B12 injections if told to by your health care provider. Take supplements only as told by your health care provider. Follow the directions carefully. Keep all follow-up visits. This is important. Contact a health care provider if: Your symptoms come back. Your symptoms get worse or do not improve with treatment. Get help right away: You develop shortness of breath. You have a rapid heart rate. You have chest pain. You become dizzy or you faint. These symptoms may be an emergency. Get help right away. Call 911. Do not wait to see if the symptoms will go away. Do not drive yourself to the hospital. Summary Vitamin B12 deficiency occurs when the body does not have enough of this important vitamin. Common causes include not eating enough foods that contain vitamin B12, not being able to absorb vitamin B12 from the food that you eat, having a surgery in which part of the stomach or small intestine is removed, or taking certain medicines. Eat foods that have vitamin B12 in them. Treatment may include making a change in the way you eat and drink, getting vitamin B12 injections, or taking vitamin B12 supplements. This information is not intended to replace advice given to you by your health care provider. Make sure you discuss any questions you have with your health care provider. Document Revised: 10/01/2020 Document Reviewed: 10/01/2020 Elsevier Patient Education  2023 [employer]vier Inc.  

## 2022-03-24 ENCOUNTER — Ambulatory Visit: Payer: BC Managed Care – PPO

## 2022-03-24 ENCOUNTER — Encounter: Payer: Self-pay | Admitting: Oncology

## 2022-03-31 ENCOUNTER — Inpatient Hospital Stay: Payer: Self-pay | Attending: Oncology

## 2022-04-21 ENCOUNTER — Other Ambulatory Visit: Payer: BC Managed Care – PPO

## 2022-04-21 ENCOUNTER — Ambulatory Visit: Payer: BC Managed Care – PPO | Admitting: Oncology

## 2022-04-21 ENCOUNTER — Ambulatory Visit: Payer: BC Managed Care – PPO

## 2022-04-28 ENCOUNTER — Other Ambulatory Visit: Payer: BC Managed Care – PPO

## 2022-04-28 ENCOUNTER — Ambulatory Visit: Payer: BC Managed Care – PPO | Admitting: Oncology

## 2022-04-28 ENCOUNTER — Ambulatory Visit: Payer: BC Managed Care – PPO

## 2023-02-19 ENCOUNTER — Encounter: Payer: Self-pay | Admitting: Oncology
